# Patient Record
Sex: Female | Born: 1975 | Race: Black or African American | Hispanic: No | Marital: Married | Smoking: Never smoker
Health system: Southern US, Community
[De-identification: ages and names within clinical notes are randomized; demographics above are authoritative.]

## PROBLEM LIST (undated history)

## (undated) ENCOUNTER — Inpatient Hospital Stay (HOSPITAL_COMMUNITY): Payer: Self-pay

## (undated) DIAGNOSIS — S82899A Other fracture of unspecified lower leg, initial encounter for closed fracture: Secondary | ICD-10-CM

## (undated) DIAGNOSIS — E0500 Thyrotoxicosis with diffuse goiter without thyrotoxic crisis or storm: Secondary | ICD-10-CM

## (undated) DIAGNOSIS — E0590 Thyrotoxicosis, unspecified without thyrotoxic crisis or storm: Principal | ICD-10-CM

## (undated) HISTORY — DX: Thyrotoxicosis with diffuse goiter without thyrotoxic crisis or storm: E05.00

## (undated) HISTORY — PX: NO PAST SURGERIES: SHX2092

## (undated) HISTORY — PX: FOOT SURGERY: SHX648

## (undated) HISTORY — DX: Thyrotoxicosis, unspecified without thyrotoxic crisis or storm: E05.90

## (undated) SURGERY — Surgical Case
Anesthesia: *Unknown

---

## 2004-11-01 ENCOUNTER — Other Ambulatory Visit: Admission: RE | Admit: 2004-11-01 | Discharge: 2004-11-01 | Payer: Self-pay | Admitting: Obstetrics and Gynecology

## 2006-08-29 ENCOUNTER — Ambulatory Visit: Payer: Self-pay | Admitting: Endocrinology

## 2006-09-12 ENCOUNTER — Encounter: Admission: RE | Admit: 2006-09-12 | Discharge: 2006-09-12 | Payer: Self-pay | Admitting: Endocrinology

## 2006-09-14 ENCOUNTER — Ambulatory Visit: Payer: Self-pay | Admitting: Endocrinology

## 2006-09-25 ENCOUNTER — Ambulatory Visit: Payer: Self-pay | Admitting: Endocrinology

## 2006-09-25 ENCOUNTER — Encounter: Admission: RE | Admit: 2006-09-25 | Discharge: 2006-09-25 | Payer: Self-pay | Admitting: Endocrinology

## 2006-10-09 ENCOUNTER — Ambulatory Visit: Payer: Self-pay | Admitting: Endocrinology

## 2006-11-22 ENCOUNTER — Encounter: Payer: Self-pay | Admitting: Endocrinology

## 2006-11-22 ENCOUNTER — Ambulatory Visit: Payer: Self-pay | Admitting: Endocrinology

## 2006-11-22 DIAGNOSIS — E0590 Thyrotoxicosis, unspecified without thyrotoxic crisis or storm: Secondary | ICD-10-CM

## 2006-11-22 HISTORY — DX: Thyrotoxicosis, unspecified without thyrotoxic crisis or storm: E05.90

## 2006-12-17 ENCOUNTER — Encounter: Payer: Self-pay | Admitting: *Deleted

## 2006-12-28 ENCOUNTER — Telehealth (INDEPENDENT_AMBULATORY_CARE_PROVIDER_SITE_OTHER): Payer: Self-pay | Admitting: *Deleted

## 2007-02-04 ENCOUNTER — Telehealth: Payer: Self-pay | Admitting: Endocrinology

## 2007-02-21 ENCOUNTER — Encounter: Payer: Self-pay | Admitting: Endocrinology

## 2007-03-12 ENCOUNTER — Ambulatory Visit: Payer: Self-pay | Admitting: Endocrinology

## 2007-03-12 ENCOUNTER — Telehealth: Payer: Self-pay | Admitting: Endocrinology

## 2007-04-05 ENCOUNTER — Telehealth: Payer: Self-pay | Admitting: Internal Medicine

## 2007-05-02 ENCOUNTER — Encounter: Payer: Self-pay | Admitting: Endocrinology

## 2007-05-13 ENCOUNTER — Ambulatory Visit: Payer: Self-pay | Admitting: Endocrinology

## 2007-05-30 ENCOUNTER — Inpatient Hospital Stay (HOSPITAL_COMMUNITY): Admission: AD | Admit: 2007-05-30 | Discharge: 2007-06-01 | Payer: Self-pay | Admitting: Obstetrics and Gynecology

## 2007-07-30 ENCOUNTER — Telehealth (INDEPENDENT_AMBULATORY_CARE_PROVIDER_SITE_OTHER): Payer: Self-pay | Admitting: *Deleted

## 2007-08-05 ENCOUNTER — Telehealth (INDEPENDENT_AMBULATORY_CARE_PROVIDER_SITE_OTHER): Payer: Self-pay | Admitting: *Deleted

## 2007-08-08 ENCOUNTER — Encounter: Payer: Self-pay | Admitting: Internal Medicine

## 2007-08-16 ENCOUNTER — Ambulatory Visit: Payer: Self-pay | Admitting: Endocrinology

## 2007-12-06 ENCOUNTER — Telehealth (INDEPENDENT_AMBULATORY_CARE_PROVIDER_SITE_OTHER): Payer: Self-pay | Admitting: *Deleted

## 2007-12-06 ENCOUNTER — Encounter: Payer: Self-pay | Admitting: Endocrinology

## 2007-12-17 ENCOUNTER — Ambulatory Visit: Payer: Self-pay | Admitting: Endocrinology

## 2008-07-06 ENCOUNTER — Telehealth (INDEPENDENT_AMBULATORY_CARE_PROVIDER_SITE_OTHER): Payer: Self-pay | Admitting: *Deleted

## 2008-07-07 ENCOUNTER — Encounter: Payer: Self-pay | Admitting: Endocrinology

## 2008-07-10 ENCOUNTER — Ambulatory Visit: Payer: Self-pay | Admitting: Endocrinology

## 2010-02-04 ENCOUNTER — Telehealth: Payer: Self-pay | Admitting: Endocrinology

## 2010-02-11 ENCOUNTER — Encounter: Payer: Self-pay | Admitting: Endocrinology

## 2010-02-21 ENCOUNTER — Ambulatory Visit: Payer: Self-pay | Admitting: Endocrinology

## 2010-04-05 NOTE — Progress Notes (Signed)
Summary: Labs  Phone Note Call from Patient Call back at Home Phone [phone]   Caller: Patient Summary of Call: Pt called stating she has appt with [facility] 12/19 and would like to have T4 done prior to appt at [facility] Pt is requesting order to be faxed. Initial call taken by: Margaret Pyle, CMA,  February 04, 2010 3:46 PM  Follow-up for Phone Call        i printed rx Follow-up by: Minus Breeding MD,  February 04, 2010 4:16 PM  Additional Follow-up for Phone Call Additional follow up Details #1::        Rx faxed to [phone] left message on machine informing pt Additional Follow-up by: Margaret Pyle, CMA,  February 07, 2010 10:01 AM    New/Updated Medications: * TSH and free t4 242.9 Prescriptions: TSH and free t4 242.9  #1 x 0   Entered by:   Margaret Pyle, CMA   Authorized by:   Minus Breeding MD   Signed by:   Margaret Pyle, CMA on 02/07/2010   Method used:   Print then Give to Patient   RxID:   [id number]

## 2010-04-07 NOTE — Assessment & Plan Note (Signed)
Summary: FU/ NWS  #   Vital Signs:  Patient profile:   35 year old female Height:      68 inches (172.72 cm) Weight:      159.25 pounds (72.39 kg) BMI:     24.30 O2 Sat:      97 % on Room air Temp:     98.2 degrees F (36.78 degrees C) oral Pulse rate:   89 / minute BP sitting:   102 / 60  (left arm) Cuff size:   regular  Vitals Entered By: Brenton Grills CMA (AAMA) (February 21, 2010 11:00 AM)  O2 Flow:  Room air CC: Follow up on thyroid/aj Is Patient Diabetic? No   Referring Provider:  horvath Primary Provider:  sanders  CC:  Follow up on thyroid/aj.  History of Present Illness: pt states she feels well in general.  she takes ptu, but has been out x 3 days.   she reports 1 day of moderate congestion in the nose, and slight assoc cough.  Current Medications (verified): 1)  Propylthiouracil 50 Mg  Tabs (Propylthiouracil) .... Take 1 By Mouth Qd 2)  Tsh .... and Free T4 242.9  Allergies (verified): No Known Drug Allergies  Past History:  Past Medical History: Last updated: 11/22/2006 Hyperthyroidism  Review of Systems  The patient denies fever and dyspnea on exertion.         denies earache  Physical Exam  General:  normal appearance.   Head:  head: no deformity eyes: no periorbital swelling, no proptosis external nose and ears are normal mouth: no lesion seen Ears:  TM's intact and clear with normal canals with grossly normal hearing.   Additional Exam:  tsh=0.9 (10 days ago)   Impression & Recommendations:  Problem # 1:  HYPERTHYROIDISM (ICD-242.90) well-controlled  Problem # 2:  uri new  Other Orders: Est. Patient Level IV (04540)  Patient Instructions: 1)  continue propylthiouracil, 50 mg once daily 2)  if ever you have fever while taking this medication, stop it and call us, because of the risk of a rare side-effect. 3)  Please schedule a follow-up appointment in 6 months. 4)  take loratadine-d (non-prescription) as needed for congestion.    5)  (update: i left message on phone-tree:  rx as we discussed) Prescriptions: PROPYLTHIOURACIL 50 MG  TABS (PROPYLTHIOURACIL) take 1 by mouth qd  #90 Each x 1   Entered and Authorized by:   Minus Breeding MD   Signed by:   Minus Breeding MD on 02/21/2010   Method used:   Electronically to        [facility] [address] * (retail)       [address]       [place], [place]  [address]       Ph: [phone] or [phone]       Fax: [phone]   RxID:   [id number]    Orders Added: 1)  Est. Patient Level IV [47425]

## 2010-05-02 ENCOUNTER — Encounter: Payer: Self-pay | Admitting: Endocrinology

## 2010-07-19 NOTE — Consult Note (Signed)
[facility]                          ENDOCRINOLOGY Connie, [name]                        MRN:          [id number]  DATE:08/29/2006                            DOB:          14-May-1975    REFERRING PHYSICIAN:  Robyn N. Allyne Gee, M.D.   REASON FOR REFERRAL:  Hyperthyroidism.   HISTORY OF PRESENT ILLNESS:  A 35 year old woman, who in March 2008, had  some screening laboratory studies at her place of employment, who was  found to have a suppressed TSH.  She states that she is able to eat all  she wants and not gain any weight.   PAST MEDICAL HISTORY:  Otherwise healthy.  She takes no medications.   SOCIAL HISTORY:  She is single.  She is here with her mother.  She works  as an Art gallery manager at [employer].   FAMILY HISTORY:  Negative for thyroid disease.   REVIEW OF SYSTEMS:  Denies the following:  Fever, tremor, palpitations,  excessive diaphoresis, shortness of breath, and anxiety.   PHYSICAL EXAMINATION:  VITAL SIGNS:  Blood pressure is 111/69, heart  rate 71, weight is 144.  GENERAL:  Healthy-appearing young woman, no distress.  SKIN:  No rash, not diaphoretic.  HEENT:  No proptosis, no periorbital swelling.  NECK:  I do not appreciate a goiter.  NEUROLOGIC:  Alert, oriented; does not appear anxious or depression.  There is no tremor.   LABORATORY STUDIES:  Forwarded by Dr. Allyne Gee.  On June 11, 2006, TSH  0.012.  On June 15, 2006, TSH 0.008.  Free T4 is 1.76 which is  borderline elevated.   IMPRESSION:  1. Hyperthyroidism of uncertain etiology.  2. Excessive appetite.  Upon treatment of her hyperthyroidism, she is      going to have to be careful not to gain weight.   PLAN:  We discussed the differential diagnosis, risks, and treatment  options of hyperthyroidism.  She states she would like to have a nuclear  medicine scan and then return for further discussion after that.     Sean A. Everardo All, MD  Electronically  Signed    SAE/MedQ  DD: 08/29/2006  DT: 08/30/2006  Job #: [id number]   cc:   Candyce Churn. Allyne Gee, M.D.  Carrington Clamp, M.D.

## 2010-07-19 NOTE — Consult Note (Signed)
[facility]                          ENDOCRINOLOGY CONSULTATION   Connie [name], Connie [name]                      MRN:          [id number]  DATE:09/25/2006                            DOB:          1975/08/06    REASON FOR VISIT:  Follow up thyroid.   HISTORY OF PRESENT ILLNESS:  A 35 year old woman who went for iodine-131  therapy, and her serum pregnancy test was positive.  She states that her  last menstrual period was two weeks ago.  She states she is feeling well  in general and does not have any symptoms of pregnancy.   PAST MEDICAL HISTORY:  This is her first pregnancy.   REVIEW OF SYSTEMS:  Denies fever, nausea, and vomiting.   PHYSICAL EXAMINATION:  Blood pressure is 117/70, heart rate 72,  temperature 97.2.  The weight is 142.  GENERAL:  No distress.  EXTREMITIES:  No edema.  NECK:  I do not appreciate a goiter.   IMPRESSION:  1. Hyperthyroidism.  2. Recently diagnosed pregnancy.   PLAN:  1. Cancel iodine i-131 therapy.  2. PTU 100 mg twice daily.  3. I have told her that if ever she has fever on this, she must      discontinue it and call us because of the possibility of a rare      side effect.  4. Return in three weeks.  5. Please contact Dr. Henderson Cloud and get yourself an appointment as a new      obstetrical patient.     Sean A. Everardo All, MD  Electronically Signed    SAE/MedQ  DD: 09/26/2006  DT: 09/27/2006  Job #: [id number]   cc:   Connie [name], M.D.  Connie [name], M.D.

## 2010-07-19 NOTE — Consult Note (Signed)
[facility]                          ENDOCRINOLOGY CONSULTATION   RHEALYNN, MYHRE                      MRN:          [id number]  DATE:10/09/2006                            DOB:          03-Apr-1975    REASON FOR VISIT:  Followup hyperthyroidism.   HISTORY OF THE PRESENT ILLNESS:  A 35 year old woman who states she is  now 6 weeks plus 2 days gestation.  On PTU 100 mg twice a day, she had  abdominal pain, nausea, and vomiting.  She states she reduced this to 50  mg twice a day and the symptoms have resolved.   PAST MEDICAL HISTORY:  Otherwise healthy.  She takes no other  medications.   REVIEW OF SYSTEMS:  She denies fever.  She has gained 5 pounds so far.   PHYSICAL EXAMINATION:  Blood pressure 103/63, heart rate 72, temperature  is 97.5, the weight is 147.  GENERAL:  No distress.  THYROID:  I do not notice a goiter.  NEUROLOGIC:  Alert, well oriented, does not appear anxious nor  depressed.   IMPRESSION:  1. Hyperthyroidism.  She is intolerant of PTU at 100 mg twice a day.  2. Six weeks plus two days gestation.   PLAN:  1. Reduce PTU to 50 mg twice a day.  2. Recheck thyroid function studies.  3. Return in 30 days.   ADDENDUM:  As of the date of this dictation, October 14, 2006, no result  has been reported for her thyroid function studies.  Therefore, will  call her to see if she had them drawn.     Sean A. Everardo All, MD  Electronically Signed    SAE/MedQ  DD: 10/14/2006  DT: 10/15/2006  Job #: [id number]   cc:   Carrington Clamp, M.D.

## 2010-11-28 LAB — CBC
HCT: 30.9 — ABNORMAL LOW
HCT: 37.1
Hemoglobin: 10.6 — ABNORMAL LOW
Hemoglobin: 13.1
MCHC: 34.2
MCV: 97.1
RBC: 3.19 — ABNORMAL LOW
RBC: 3.91
RDW: 13.4

## 2011-05-22 ENCOUNTER — Telehealth: Payer: Self-pay | Admitting: *Deleted

## 2011-05-22 NOTE — Telephone Encounter (Signed)
Pt wants thyroid labs faxed to [facility] so that she can have labs done before appointment.

## 2011-05-23 NOTE — Telephone Encounter (Signed)
Pt informed of MD's advisement but wants labs faxed to [facility] before appointment so that labs can be addressed during appointment and changes made at appointment.

## 2011-05-23 NOTE — Telephone Encounter (Signed)
Pt is too far overdue for appt, so we'll need to do here.

## 2011-05-23 NOTE — Telephone Encounter (Signed)
Ov is overdue.  Let's check then

## 2011-05-25 NOTE — Telephone Encounter (Signed)
Pt informed of MD's advisement, pt states that she will callback to make appointment but was upset because she wants lab faxed to another provider.

## 2011-05-26 ENCOUNTER — Encounter: Payer: Self-pay | Admitting: Endocrinology

## 2011-05-26 ENCOUNTER — Ambulatory Visit (INDEPENDENT_AMBULATORY_CARE_PROVIDER_SITE_OTHER): Payer: BC Managed Care – PPO | Admitting: Endocrinology

## 2011-05-26 VITALS — BP 102/62 | HR 61 | Temp 98.0°F

## 2011-05-26 DIAGNOSIS — E0590 Thyrotoxicosis, unspecified without thyrotoxic crisis or storm: Secondary | ICD-10-CM

## 2011-05-26 MED ORDER — METHIMAZOLE 5 MG PO TABS: 5.0000 mg | ORAL_TABLET | Freq: Three times a day (TID) | ORAL | Status: DC

## 2011-05-26 NOTE — Progress Notes (Signed)
  Subjective:    Patient ID: Connie [name], female    DOB: 1975-05-20, 36 y.o.   MRN: [id number]  HPI Pt returns for f/u of hyperthyroidism, of uncertain etiology.  She has been on ptu, due to pregnancy in 2009.  pt states she feels well in general.  Past Medical History  Diagnosis Date  . HYPERTHYROIDISM 11/22/2006    Qualifier: Diagnosis of  By: Everardo All MD, Advith Martine A     No past surgical history on file.  History   Social History  . Marital Status: Married    Spouse Name: N/A    Number of Children: N/A  . Years of Education: N/A   Occupational History  . Not on file.   Social History Main Topics  . Smoking status: Never Smoker   . Smokeless tobacco: Not on file  . Alcohol Use: Not on file  . Drug Use: Not on file  . Sexually Active: Not on file   Other Topics Concern  . Not on file   Social History Narrative  . No narrative on file    No current outpatient prescriptions on file prior to visit.    No Known Allergies  No family history on file.  BP 102/62  Pulse 61  Temp(Src) 98 F (36.7 C) (Oral)  SpO2 99%  LMP 05/25/2011    Review of Systems Denies fever    Objective:   Physical Exam VITAL SIGNS:  See vs page GENERAL: no distress NECK: There is no palpable thyroid enlargement.  No thyroid nodule is palpable.  No palpable lymphadenopathy at the anterior neck.      Assessment & Plan:

## 2011-05-26 NOTE — Patient Instructions (Addendum)
Here is a request to get your blood test done in [place].   Please return in 1 year. You should also have the blood tests done in [place] in 6 months.   Change propylthiouracil to methimazole, 5 mg daily.  Although side-effect are are, they are even less likely with the methimazole.   if ever you have fever while taking methimazole, stop it and call us, because of the risk of a rare side-effect.

## 2011-09-01 ENCOUNTER — Telehealth: Payer: Self-pay | Admitting: Endocrinology

## 2011-09-01 NOTE — Telephone Encounter (Signed)
Forward to Dr. Everardo All on 09-01-11 ym

## 2012-05-31 ENCOUNTER — Ambulatory Visit: Payer: BC Managed Care – PPO | Admitting: Endocrinology

## 2012-05-31 DIAGNOSIS — Z0289 Encounter for other administrative examinations: Secondary | ICD-10-CM

## 2012-09-10 ENCOUNTER — Encounter: Payer: Self-pay | Admitting: Nurse Practitioner

## 2013-01-10 ENCOUNTER — Ambulatory Visit: Payer: BC Managed Care – PPO | Admitting: Endocrinology

## 2013-01-24 ENCOUNTER — Encounter: Payer: Self-pay | Admitting: Endocrinology

## 2013-01-24 ENCOUNTER — Ambulatory Visit (INDEPENDENT_AMBULATORY_CARE_PROVIDER_SITE_OTHER): Payer: BC Managed Care – PPO | Admitting: Endocrinology

## 2013-01-24 VITALS — BP 110/64 | HR 75 | Temp 98.4°F | Resp 10 | Wt 152.8 lb

## 2013-01-24 DIAGNOSIS — E0590 Thyrotoxicosis, unspecified without thyrotoxic crisis or storm: Secondary | ICD-10-CM

## 2013-01-24 LAB — TSH: TSH: 2.33 u[IU]/mL (ref 0.35–5.50)

## 2013-01-24 NOTE — Patient Instructions (Signed)
blood tests are being requested for you today.  We'll contact you with results. Please come back for a follow-up appointment in 6 months, or sooner if the pregnancy happens.   if ever you have fever while taking methimazole, stop it and call us, because of the risk of a rare side-effect.

## 2013-01-24 NOTE — Progress Notes (Signed)
  Subjective:    Patient ID: Connie [name], female    DOB: [birth year], 37 y.o.   MRN: [id number]  HPI Pt returns for f/u of hyperthyroidism, due to grave's dz.  It was dx'ed in 2008, on a routine blood test.  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  She was scheduled for i-131 rx, but pregnancy test on that day was positive.  She was started on ptu, and has been on it since.  She takes tapazole as rx'ed.  pt states she feels well in general.  She has been attempting pregnancy recently, but without success.   Past Medical History  Diagnosis Date  . HYPERTHYROIDISM 11/22/2006    Qualifier: Diagnosis of  By: Everardo All MD, Gregary Signs A     History reviewed. No pertinent past surgical history.  History   Social History  . Marital Status: Married    Spouse Name: N/A    Number of Children: N/A  . Years of Education: N/A   Occupational History  . Not on file.   Social History Main Topics  . Smoking status: Never Smoker   . Smokeless tobacco: Not on file  . Alcohol Use: Not on file  . Drug Use: Not on file  . Sexual Activity: Not on file   Other Topics Concern  . Not on file   Social History Narrative  . No narrative on file    Current Outpatient Prescriptions on File Prior to Visit  Medication Sig Dispense Refill  . methimazole (TAPAZOLE) 5 MG tablet Take 1 tablet (5 mg total) by mouth 3 (three) times daily. Please cancel the 30 day rx just sent  90 tablet  0   No current facility-administered medications on file prior to visit.   No Known Allergies  Family History  Problem Relation Age of Onset  . Diabetes Mother   . Heart disease Mother   . Diabetes Father   . Heart disease Father    BP 110/64  Pulse 75  Temp(Src) 98.4 F (36.9 C) (Oral)  Resp 10  Wt 152 lb 12.8 oz (69.31 kg)  SpO2 96%  Review of Systems Denies fever    Objective:   Physical Exam VITAL SIGNS:  See vs page GENERAL: no distress NECK: There is no palpable thyroid enlargement.  No thyroid nodule is palpable.  No  palpable lymphadenopathy at the anterior neck.   Lab Results  Component Value Date   TSH 2.33 01/24/2013      Assessment & Plan:  Hyperthyroidism: well-controlled

## 2013-02-07 ENCOUNTER — Ambulatory Visit: Payer: BC Managed Care – PPO | Admitting: Endocrinology

## 2013-05-29 ENCOUNTER — Other Ambulatory Visit: Payer: Self-pay | Admitting: Obstetrics and Gynecology

## 2013-05-29 DIAGNOSIS — N6310 Unspecified lump in the right breast, unspecified quadrant: Secondary | ICD-10-CM

## 2013-06-02 ENCOUNTER — Ambulatory Visit
Admission: RE | Admit: 2013-06-02 | Discharge: 2013-06-02 | Disposition: A | Discharge status: Home / Self Care | Payer: BC Managed Care – PPO | Source: Ambulatory Visit | Attending: Obstetrics and Gynecology | Admitting: Obstetrics and Gynecology

## 2013-06-02 ENCOUNTER — Other Ambulatory Visit: Payer: BC Managed Care – PPO

## 2013-06-02 DIAGNOSIS — N6310 Unspecified lump in the right breast, unspecified quadrant: Secondary | ICD-10-CM

## 2013-06-06 ENCOUNTER — Other Ambulatory Visit: Payer: BC Managed Care – PPO

## 2013-12-22 ENCOUNTER — Encounter: Payer: Self-pay | Admitting: Endocrinology

## 2013-12-22 ENCOUNTER — Ambulatory Visit (INDEPENDENT_AMBULATORY_CARE_PROVIDER_SITE_OTHER): Payer: BC Managed Care – PPO | Admitting: Endocrinology

## 2013-12-22 VITALS — BP 116/52 | HR 72 | Temp 98.3°F | Ht 68.0 in | Wt 155.0 lb

## 2013-12-22 DIAGNOSIS — E0500 Thyrotoxicosis with diffuse goiter without thyrotoxic crisis or storm: Secondary | ICD-10-CM

## 2013-12-22 NOTE — Progress Notes (Signed)
   Subjective:    Patient ID: Connie [name], female    DOB: 03-10-1975, 38 y.o.   MRN: [id number]  HPI Pt returns for f/u of hyperthyroidism, due to grave's dz.  It was dx'ed in 2008, on a routine blood test.  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  She was scheduled for i-131 rx in 2008, but pregnancy test on that day was positive. She takes tapazole as rx'ed.  pt states she feels well in general.  She has been attempting pregnancy recently, but without success.  Past Medical History  Diagnosis Date  . HYPERTHYROIDISM 11/22/2006    Qualifier: Diagnosis of  By: Everardo All MD, Jermisha Hoffart A     No past surgical history on file.  History   Social History  . Marital Status: Married    Spouse Name: N/A    Number of Children: N/A  . Years of Education: N/A   Occupational History  . Not on file.   Social History Main Topics  . Smoking status: Never Smoker   . Smokeless tobacco: Not on file  . Alcohol Use: Not on file  . Drug Use: Not on file  . Sexual Activity: Not on file   Other Topics Concern  . Not on file   Social History Narrative  . No narrative on file    No current outpatient prescriptions on file prior to visit.   No current facility-administered medications on file prior to visit.    No Known Allergies  Family History  Problem Relation Age of Onset  . Diabetes Mother   . Heart disease Mother   . Diabetes Father   . Heart disease Father    BP 116/52  Pulse 72  Temp(Src) 98.3 F (36.8 C) (Oral)  Ht 5\' 8"  (1.727 m)  Wt 155 lb (70.308 kg)  BMI 23.57 kg/m2  SpO2 98%  Review of Systems Denies fever    Objective:   Physical Exam VITAL SIGNS:  See vs page GENERAL: no distress NECK: There is no palpable thyroid enlargement.  No thyroid nodule is palpable.  No palpable lymphadenopathy at the anterior neck.     outside test results are reviewed: TSH=6    Assessment & Plan:  Hyperthyroidism, overcontrolled.     Patient is advised the following: Patient Instructions    Please stop taking the methimazole. Please recheck the thyroid blood tests in 1 month. Please come back for a follow-up appointment in 4 months

## 2013-12-22 NOTE — Patient Instructions (Addendum)
Please stop taking the methimazole. Please recheck the thyroid blood tests in 1 month. Please come back for a follow-up appointment in 4 months

## 2014-02-05 ENCOUNTER — Telehealth: Payer: Self-pay | Admitting: Endocrinology

## 2014-02-05 NOTE — Telephone Encounter (Signed)
Patient asked if Dr Everardo All could fax her lab orders over to [facility] care Phone # [phone] (she lost the first set of labs orders that was given to her)

## 2014-02-06 NOTE — Telephone Encounter (Signed)
Labs faxed. Pt advised.

## 2014-02-06 NOTE — Telephone Encounter (Signed)
Patient stated that [facility] care haven't received lab order yet, could they be faxed today? Thank you

## 2014-02-06 NOTE — Telephone Encounter (Signed)
See below and please advise. I could not see orders.  Thanks!

## 2014-02-06 NOTE — Telephone Encounter (Signed)
i printed 

## 2014-02-09 ENCOUNTER — Telehealth: Payer: Self-pay | Admitting: Endocrinology

## 2014-02-09 NOTE — Telephone Encounter (Signed)
please call patient: Thyroid blood tests are normal--good i'll see you next time.

## 2014-02-09 NOTE — Telephone Encounter (Signed)
Lvom advising of below. Requested call back if pt would like to discuss.  

## 2014-02-10 ENCOUNTER — Telehealth: Payer: Self-pay | Admitting: Endocrinology

## 2014-02-10 NOTE — Telephone Encounter (Signed)
Patient is calling for the results of her lab work °

## 2014-02-10 NOTE — Telephone Encounter (Signed)
Pt advised that thyroid blood test were normal. Pt voiced understanding.

## 2014-02-23 ENCOUNTER — Encounter: Payer: Self-pay | Admitting: Endocrinology

## 2014-03-03 ENCOUNTER — Other Ambulatory Visit: Payer: Self-pay | Admitting: *Deleted

## 2014-03-03 ENCOUNTER — Telehealth: Payer: Self-pay | Admitting: Endocrinology

## 2014-03-03 DIAGNOSIS — E0590 Thyrotoxicosis, unspecified without thyrotoxic crisis or storm: Secondary | ICD-10-CM

## 2014-03-03 NOTE — Telephone Encounter (Signed)
Patient would like her lab order faxed over to [facility] Fax # [phone]

## 2014-03-03 NOTE — Telephone Encounter (Signed)
OK. I assume this is for TFTs.Marland Kitchen..Marland Kitchen

## 2014-03-03 NOTE — Telephone Encounter (Signed)
Please read note below and advise.  

## 2014-03-03 NOTE — Telephone Encounter (Signed)
[facility] [phone].

## 2014-03-06 NOTE — L&D Delivery Note (Signed)
Patient was C/C/crowning with [name]. Pt moved to OR for delivery. [name] second degree episiotomy was done for room for breech extraction.  Pt pushed for 5 minutes with epidural.   NSVD  [name] female infant, Apgars 9,9, weight P.   Cord clamped and baby handed off.  Vaginal exam revealed complete breech of [name]. ROM second sac, clear fluid. Feet of [name] grasped together and breech extraction done without complication. Delivery of after coming head was spontaneous.  Breech delivery [name], Apgars 8,9, weight P. Cord marked.  Cord bloods obained on both.    The patient had repair of second degree midline episiotomy with 2-0 vicryl. Fundus was firm. EBL was expected amount. Placenta was delivered intact and sent to path. Vagina was clear.  Babies were vigorous and were doing skin to skin with mother after moving from OR.  Connie [name]

## 2014-04-08 ENCOUNTER — Ambulatory Visit (INDEPENDENT_AMBULATORY_CARE_PROVIDER_SITE_OTHER): Payer: Self-pay | Admitting: Endocrinology

## 2014-04-08 ENCOUNTER — Encounter: Payer: Self-pay | Admitting: Endocrinology

## 2014-04-08 VITALS — BP 112/54 | HR 71 | Temp 98.2°F | Ht 68.0 in | Wt 157.0 lb

## 2014-04-08 DIAGNOSIS — E0590 Thyrotoxicosis, unspecified without thyrotoxic crisis or storm: Secondary | ICD-10-CM | POA: Diagnosis not present

## 2014-04-08 LAB — T4, FREE: Free T4: 0.82 ng/dL (ref 0.60–1.60)

## 2014-04-08 LAB — TSH: TSH: 0.81 u[IU]/mL (ref 0.35–4.50)

## 2014-04-08 NOTE — Progress Notes (Signed)
   Subjective:    Patient ID: Connie [name], female    DOB: [birth year], 39 y.o.   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  pt states she feels no different, and well in general.  Denies tremor.  Past Medical History  Diagnosis Date  . HYPERTHYROIDISM 11/22/2006    Qualifier: Diagnosis of  By: Everardo All MD, Debraann Livingstone A     No past surgical history on file.  History   Social History  . Marital Status: Married    Spouse Name: N/A    Number of Children: N/A  . Years of Education: N/A   Occupational History  . Not on file.   Social History Main Topics  . Smoking status: Never Smoker   . Smokeless tobacco: Not on file  . Alcohol Use: Not on file  . Drug Use: Not on file  . Sexual Activity: Not on file   Other Topics Concern  . Not on file   Social History Narrative    No current outpatient prescriptions on file prior to visit.   No current facility-administered medications on file prior to visit.    No Known Allergies  Family History  Problem Relation Age of Onset  . Diabetes Mother   . Heart disease Mother   . Diabetes Father   . Heart disease Father     BP 112/54 mmHg  Pulse 71  Temp(Src) 98.2 F (36.8 C) (Oral)  Ht 5\' 8"  (1.727 m)  Wt 157 lb (71.215 kg)  BMI 23.88 kg/m2  SpO2 97%    Review of Systems Denies weight change and palpitations    Objective:   Physical Exam VITAL SIGNS:  See vs page GENERAL: no distress NECK: There is no palpable thyroid enlargement.  No thyroid nodule is palpable.  No palpable lymphadenopathy at the anterior neck.    Lab Results  Component Value Date   TSH 0.81 04/08/2014      Assessment & Plan:  Hyperthyroidism.  Euthyroid now, but she is at risk  for recurrence.  Patient is advised the following: Patient Instructions  blood tests are being requested for you today.  We'll let you know about the results. If it is high, we'll resume the methimazole at a lower amount.

## 2014-04-08 NOTE — Patient Instructions (Addendum)
blood tests are being requested for you today.  We'll let you know about the results. If it is high, we'll resume the methimazole at a lower amount.

## 2014-04-20 ENCOUNTER — Ambulatory Visit: Payer: BC Managed Care – PPO | Admitting: Endocrinology

## 2014-05-22 ENCOUNTER — Telehealth: Payer: Self-pay | Admitting: Endocrinology

## 2014-05-22 DIAGNOSIS — E0590 Thyrotoxicosis, unspecified without thyrotoxic crisis or storm: Secondary | ICD-10-CM

## 2014-05-22 NOTE — Telephone Encounter (Signed)
Pt has had what feels like a racing heartbeat for the last week or so since Dr. Everardo All took her off of the methimazole.

## 2014-05-22 NOTE — Telephone Encounter (Signed)
See note below and please advise, Thanks! 

## 2014-05-22 NOTE — Telephone Encounter (Signed)
Please come in for thyroid labs.  i have have ordered

## 2014-05-25 NOTE — Telephone Encounter (Signed)
Pt is coming to have lab work done 05/26/2014.

## 2014-05-26 ENCOUNTER — Other Ambulatory Visit: Payer: BLUE CROSS/BLUE SHIELD

## 2014-05-28 ENCOUNTER — Other Ambulatory Visit (INDEPENDENT_AMBULATORY_CARE_PROVIDER_SITE_OTHER): Payer: BLUE CROSS/BLUE SHIELD

## 2014-05-28 DIAGNOSIS — E0590 Thyrotoxicosis, unspecified without thyrotoxic crisis or storm: Secondary | ICD-10-CM | POA: Diagnosis not present

## 2014-05-28 LAB — TSH: TSH: 0.06 u[IU]/mL — ABNORMAL LOW (ref 0.35–4.50)

## 2014-05-28 LAB — T4, FREE: FREE T4: 3.88 ng/dL — AB (ref 0.60–1.60)

## 2014-06-03 ENCOUNTER — Telehealth: Payer: Self-pay | Admitting: Endocrinology

## 2014-06-03 ENCOUNTER — Other Ambulatory Visit: Payer: Self-pay | Admitting: Endocrinology

## 2014-06-03 MED ORDER — METHIMAZOLE 10 MG PO TABS: 10.0000 mg | ORAL_TABLET | Freq: Three times a day (TID) | ORAL | Status: DC

## 2014-06-03 MED ORDER — METHIMAZOLE 10 MG PO TABS
10.0000 mg | ORAL_TABLET | Freq: Three times a day (TID) | ORAL | Status: DC
Start: 2014-06-03 — End: 2014-06-12

## 2014-06-03 NOTE — Telephone Encounter (Signed)
[facility] [place] [address] [address] [employer] information, and she need it today

## 2014-06-03 NOTE — Telephone Encounter (Signed)
Rx sent to pharmacy per request. Unable to reach pt and advise that rx had been sent.

## 2014-06-12 ENCOUNTER — Telehealth: Payer: Self-pay | Admitting: Endocrinology

## 2014-06-12 MED ORDER — PROPYLTHIOURACIL 50 MG PO TABS: 100.0000 mg | ORAL_TABLET | Freq: Three times a day (TID) | ORAL | Status: DC

## 2014-06-12 NOTE — Telephone Encounter (Signed)
See note below and please advise, Thanks! 

## 2014-06-12 NOTE — Telephone Encounter (Signed)
It probably doesn't matter, but i have sent a prescription to your pharmacy, to change to a different med. i'll see you next time.

## 2014-06-12 NOTE — Telephone Encounter (Signed)
Patient called stating that she is trying to conceive and is concerned with the Methimazole   Please advise patient if she is to stop using this   Thank you

## 2014-06-12 NOTE — Telephone Encounter (Signed)
Pt advised that rx has been sent

## 2014-08-21 LAB — US OB FOLLOW UP

## 2014-08-21 LAB — OB RESULTS CONSOLE HEPATITIS B SURFACE ANTIGEN: HEP B S AG: NEGATIVE

## 2014-08-21 LAB — OB RESULTS CONSOLE RUBELLA ANTIBODY, IGM: Rubella: IMMUNE

## 2014-08-21 LAB — OB RESULTS CONSOLE GC/CHLAMYDIA
Chlamydia: NEGATIVE
Gonorrhea: NEGATIVE

## 2014-08-21 LAB — OB RESULTS CONSOLE HGB/HCT, BLOOD
HCT: 28 %
Hemoglobin: 11.7 g/dL

## 2014-08-21 LAB — OB RESULTS CONSOLE PLATELET COUNT: PLATELETS: 257 10*3/uL

## 2014-08-21 LAB — OB RESULTS CONSOLE HIV ANTIBODY (ROUTINE TESTING): HIV: NONREACTIVE

## 2014-08-21 LAB — OB RESULTS CONSOLE RPR: RPR: NONREACTIVE

## 2014-08-24 ENCOUNTER — Encounter (HOSPITAL_COMMUNITY): Payer: Self-pay | Admitting: Obstetrics and Gynecology

## 2014-09-01 ENCOUNTER — Ambulatory Visit (HOSPITAL_COMMUNITY)
Admission: RE | Admit: 2014-09-01 | Discharge: 2014-09-01 | Disposition: A | Discharge status: Home / Self Care | Payer: BLUE CROSS/BLUE SHIELD | Source: Ambulatory Visit | Attending: Obstetrics and Gynecology | Admitting: Obstetrics and Gynecology

## 2014-09-01 DIAGNOSIS — O09521 Supervision of elderly multigravida, first trimester: Secondary | ICD-10-CM

## 2014-09-01 DIAGNOSIS — Z3A12 12 weeks gestation of pregnancy: Secondary | ICD-10-CM

## 2014-09-01 DIAGNOSIS — O30049 Twin pregnancy, dichorionic/diamniotic, unspecified trimester: Secondary | ICD-10-CM

## 2014-09-03 ENCOUNTER — Encounter (HOSPITAL_COMMUNITY): Payer: Self-pay

## 2014-09-03 DIAGNOSIS — O09529 Supervision of elderly multigravida, unspecified trimester: Secondary | ICD-10-CM | POA: Insufficient documentation

## 2014-09-03 NOTE — Progress Notes (Signed)
Genetic Counseling  High-Risk Gestation Note  Appointment Date:  09/01/2014 Referred By: Carrington Clamp, MD Date of Birth:  [birth year]   Pregnancy History: G2P1 Estimated Date of Delivery: 03/15/15 Estimated Gestational Age: [redacted]w[redacted]d Attending: Particia Nearing, MD   Connie [name] was seen for genetic counseling because of a maternal age of 39 y.o. in a dichorionic/diamniotic twin gestation. She will be 39 years old at delivery.      In summary:  Discussed maternal age-related chance for fetal aneuploidy in dichorionic diamniotic twin gestation  Discussed available screening and testing options; Patient elected to proceed with NIPS ([facility]) today, understanding it is not diagnostic and has lower sensitivity in twin gestation  She declined CVS and amniocentesis today but would possibly consider amniocentesis in the case of positive NIPS result and/or findings on second trimester detailed ultrasound  She was counseled regarding maternal age and the association with risk for chromosome conditions due to nondisjunction with aging of the ova.   We reviewed chromosomes, nondisjunction, and the associated 1 in 45 risk for fetal aneuploidy related to a maternal age of 39 year old at in a twin gestation.  She was counseled that the risk for aneuploidy decreases as gestational age increases, accounting for those pregnancies which spontaneously abort.  We specifically discussed Down syndrome (trisomy 70), trisomies 46 and 42, and sex chromosome aneuploidies (47,XXX and 47,XXY) including the common features and prognoses of each.   We reviewed available screening options including noninvasive prenatal screening (NIPS)/cell free DNA (cfDNA) testing and detailed ultrasound.  She was counseled that screening tests are used to modify a patient's a priori risk for aneuploidy, typically based on age. This estimate provides a pregnancy specific risk assessment. We reviewed the benefits and limitations of each  option. Specifically, we discussed the conditions for which each test screens, the detection rates, and false positive rates of each. We reviewed that NIPS has lower detection rate in a twin gestation and that the results cannot distinguish between each twin. She was also counseled regarding diagnostic testing via CVS and amniocentesis. We reviewed the approximate 1 in 100 risk for complications for CVS and the approximate 1 in 300-500 risk for complications for amniocentesis, including spontaneous pregnancy loss. After consideration of all the options, she elected to proceed with NIPS today ([facility] through [facility]).  Those results will be available in 8-10 days. She declined CVS and amniocentesis at this time. She may consider amniocentesis pending results of NIPS and detailed ultrasound in the second trimester. She understands that screening tests cannot rule out all birth defects or genetic syndromes. The patient was advised of this limitation and states she still does not want additional testing at this time.    Detailed ultrasound is also available to the patient. Her OB office may contact us, if follow-up with our office is desired.   Connie [name] was provided with written information regarding sickle cell anemia (SCA) including the carrier frequency and incidence in the Hispanic/African-American population, the availability of carrier testing and prenatal diagnosis if indicated.  In addition, we discussed that hemoglobinopathies are routinely screened for as part of the [facility] newborn screening panel.  She declined hemoglobin electrophoresis today, given that she possibly had this screening performed prior to conception. The patient reported that she had a blood test performed prior to conception. Her description of the blood test seemed consistent with expanded carrier screening, which may or may not have included hemoglobinopathies. She reported that this blood test was normal, though  we do not  have medical documentation of this result at this time. If screening for hemoglobinopathies was not performed, this is available to her via hemoglobin electrophoresis.   Both family histories were reviewed and found to be contributory for Asperger syndrome for a cousin to the father of the pregnancy. The patient reported that this cousin is female but did not know if it was a maternal or paternal relative to the father of the pregnancy. We discussed that Asperger syndrome is part of the spectrum of conditions referred to as Autistic spectrum disorders (ASD). We discussed that ASDs are among the most common neurodevelopmental disorders, with approximately 1 in 68 children meeting criteria for ASD, according to the Centers for Disease Control. Approximately 80% of individuals diagnosed are female. There is strong evidence that genetic factors play a critical role in development of ASD. There have been recent advances in identifying specific genetic causes of ASD, however, there are still many individuals for whom the etiology of the ASD is not known.  In the absence of an identified genetic etiology, prenatal screening or testing would not be available in the current pregnancy for the autism spectrum disorders in the family. Given the reported family history and degree of relation, recurrence risk would likely be similar to the general population risk. Without further information regarding the provided family history, an accurate genetic risk cannot be calculated. Further genetic counseling is warranted if more information is obtained.  [name] denied exposure to environmental toxins or chemical agents. She denied the use of tobacco or street drugs. She reported drinking a sample glass of wine approximately 2 months ago.  Prenatal alcohol exposure can increase the risk for growth delays, small head size, heart defects, eye and facial differences, as well as behavior problems and learning disabilities. The risk of  these to occur tends to increase with the amount of alcohol consumed. However, because there is no identified safe amount of alcohol in pregnancy, it is recommended to completely avoid alcohol in pregnancy. Given the reported amount of exposure, risk for associated effects are likely low in the current pregnancy. She denied significant viral illnesses during the course of her pregnancy. Her medical and surgical histories were contributory for thyroid disease for which she is currently treated with medication.   I counseled Connie [name] regarding the above risks and available options.  The approximate face-to-face time with the genetic counselor was 45 minutes.  Quinn Plowman, MS,  Certified Genetic Counselor 09/03/2014

## 2014-09-04 ENCOUNTER — Encounter (HOSPITAL_COMMUNITY): Payer: BLUE CROSS/BLUE SHIELD

## 2014-09-08 ENCOUNTER — Telehealth (HOSPITAL_COMMUNITY): Payer: Self-pay | Admitting: MS"

## 2014-09-08 ENCOUNTER — Encounter (HOSPITAL_COMMUNITY): Payer: Self-pay | Admitting: Obstetrics and Gynecology

## 2014-09-08 NOTE — Telephone Encounter (Signed)
Left message for patient to return call.  Connie [name] Connie [name] 09/08/2014

## 2014-09-10 ENCOUNTER — Telehealth (HOSPITAL_COMMUNITY): Payer: Self-pay | Admitting: MS"

## 2014-09-10 NOTE — Telephone Encounter (Signed)
Called Connie [name] to discuss her cell free DNA test results.  Connie [name] had  [facility] testing through [facility].  Testing was offered because of advanced maternal age in a twin gestation.   The patient was identified by name and DOB.  We reviewed that these are within normal limits, showing a less than 1 in 10,000 risk for trisomies 21, 18 and 13.  X and Y analysis are not performed by [facility] for a twin gestation. We reviewed that this testing identifies approximately 90% of pregnancies with trisomy 35 in a twin gestation. She understands that this testing does not identify all genetic conditions.  All questions were answered to her satisfaction, she was encouraged to call with additional questions or concerns.  Quinn Plowman, MS Certified Genetic Counselor 09/10/2014 1:47 PM

## 2014-09-11 ENCOUNTER — Other Ambulatory Visit (HOSPITAL_COMMUNITY): Payer: Self-pay | Admitting: Obstetrics and Gynecology

## 2014-09-30 LAB — OB RESULTS CONSOLE HGB/HCT, BLOOD: Hemoglobin: 9.6 g/dL

## 2014-10-05 DIAGNOSIS — S82899A Other fracture of unspecified lower leg, initial encounter for closed fracture: Secondary | ICD-10-CM

## 2014-10-05 HISTORY — DX: Other fracture of unspecified lower leg, initial encounter for closed fracture: S82.899A

## 2014-10-17 ENCOUNTER — Inpatient Hospital Stay (HOSPITAL_COMMUNITY)
Admission: AD | Admit: 2014-10-17 | Discharge: 2014-10-17 | Disposition: A | Discharge status: Home / Self Care | Payer: BLUE CROSS/BLUE SHIELD | Source: Ambulatory Visit | Attending: Obstetrics and Gynecology | Admitting: Obstetrics and Gynecology

## 2014-10-17 ENCOUNTER — Inpatient Hospital Stay (HOSPITAL_COMMUNITY): Payer: BLUE CROSS/BLUE SHIELD

## 2014-10-17 ENCOUNTER — Encounter (HOSPITAL_COMMUNITY): Payer: Self-pay | Admitting: *Deleted

## 2014-10-17 DIAGNOSIS — R109 Unspecified abdominal pain: Secondary | ICD-10-CM | POA: Diagnosis not present

## 2014-10-17 DIAGNOSIS — O9989 Other specified diseases and conditions complicating pregnancy, childbirth and the puerperium: Secondary | ICD-10-CM

## 2014-10-17 DIAGNOSIS — O26899 Other specified pregnancy related conditions, unspecified trimester: Secondary | ICD-10-CM

## 2014-10-17 DIAGNOSIS — Z3A18 18 weeks gestation of pregnancy: Secondary | ICD-10-CM | POA: Insufficient documentation

## 2014-10-17 DIAGNOSIS — O30002 Twin pregnancy, unspecified number of placenta and unspecified number of amniotic sacs, second trimester: Secondary | ICD-10-CM

## 2014-10-17 HISTORY — DX: Other fracture of unspecified lower leg, initial encounter for closed fracture: S82.899A

## 2014-10-17 LAB — GLUCOSE, CAPILLARY: Glucose-Capillary: 109 mg/dL — ABNORMAL HIGH (ref 65–99)

## 2014-10-17 NOTE — Discharge Instructions (Signed)

## 2014-10-17 NOTE — MAU Provider Note (Signed)
History     CSN: [id number]  Arrival date and time: 10/17/14 8657   First Provider Initiated Contact with Patient 10/17/14 2018      Chief Complaint  Patient presents with  . Abdominal Cramping   HPI  Ms. Connie [name] is a 39 y.o. G2P1 at [redacted]w[redacted]d with twins who presents to MAU today with complaint of abdominal pain and syncope. The patient had a syncopal episode last night while walking to her car after she began to feel flushed and diaphoretic while outside at a baseball game. She states that her friend observed her fall forward and she had LOC. She states that since then she feels that her "belly has dropped" and she has had to urinate a lot. She denies vaginal bleeding, discharge or LOF. She states some overall body aching including the lower abdomen. She also had a significant ankle injury that was addressed at another medical facility prior to arrival. The patient also states that she had CBC (Hgb 10), EKG (normal) and UA (normal) per patient at the other facility.   OB History    Gravida Para Term Preterm AB TAB SAB Ectopic Multiple Living   Past Medical History  Diagnosis Date  . HYPERTHYROIDISM 11/22/2006    Qualifier: Diagnosis of  By: Everardo All MD, Sean A     No past surgical history on file.  Family History  Problem Relation Age of Onset  . Diabetes Mother   . Heart disease Mother   . Diabetes Father   . Heart disease Father     Social History  Substance Use Topics  . Smoking status: Never Smoker   . Smokeless tobacco: Not on file  . Alcohol Use: Not on file    Allergies: No Known Allergies  Prescriptions prior to admission  Medication Sig Dispense Refill Last Dose  . Doxylamine-Pyridoxine (DICLEGIS) 10-10 MG TBEC Take 1 tablet by mouth at bedtime.   10/16/2014 at Unknown time  . famotidine (PEPCID) 20 MG tablet Take 20 mg by mouth at bedtime.   10/16/2014 at Unknown time  . folic acid (FOLVITE) 1 MG tablet Take 1 mg by mouth at bedtime.    10/16/2014 at Unknown time  . Prenatal Vit-Fe Fumarate-FA (PRENATAL MULTIVITAMIN) TABS tablet Take 1 tablet by mouth at bedtime.   Past Week at Unknown time  . propylthiouracil (PTU) 50 MG tablet Take 2 tablets (100 mg total) by mouth 3 (three) times daily. 180 tablet 2 10/16/2014 at Unknown time    Review of Systems  Constitutional: Negative for fever and malaise/fatigue.  Gastrointestinal: Positive for abdominal pain. Negative for nausea, vomiting, diarrhea and constipation.  Genitourinary: Negative for dysuria, urgency and frequency.  Neurological: Positive for loss of consciousness. Negative for dizziness.   Physical Exam   Blood pressure 122/66, pulse 102, temperature 98.7 F (37.1 C), temperature source Oral, resp. rate 16, height  (1.676 m), weight 161 lb (73.029 kg), last menstrual period 06/05/2014, SpO2 100 %.  Physical Exam  Nursing note and vitals reviewed. Constitutional: She is oriented to person, place, and time. She appears well-developed and well-nourished. No distress.  HENT:  Head: Normocephalic and atraumatic.  Cardiovascular: Normal rate.   Respiratory: Effort normal.  GI: Soft. She exhibits no distension and no mass. There is no tenderness. There is no rebound and no guarding.  Neurological: She is alert and oriented to person, place, and time.  Skin: Skin  is warm and dry. No erythema.  Psychiatric: She has a normal mood and affect.   Results for orders placed or performed during the hospital encounter of 10/17/14 (from the past 24 hour(s))  Glucose, capillary     Status: Abnormal   Collection Time: 10/17/14  8:31 PM  Result Value Ref Range   Glucose-Capillary 109 (H) 65 - 99 mg/dL    MAU Course  Procedures None  MDM Dr. Henderson Cloud called ahead of patient arrival to inform us of her reason for visit. She advised [place] for AFI, placenta and cervical length.  CBG, [place] today 2115 - patient in [place]. Care turned over to [facility], CNM Notified Dr Henderson Cloud of  U/S results   Marny Lowenstein, PA-C  10/17/2014, 9:16 PM   Assessment and Plan  Abdominal Pain in pregnancy  Discharge to home

## 2014-10-17 NOTE — MAU Note (Addendum)
Fainted last night and broke left ankle.  Was told to have OB ultrasound for cramping today.  Thought belly seemed lower and urinating a lot and not sure if loss any fluid.

## 2014-12-01 ENCOUNTER — Telehealth: Payer: Self-pay | Admitting: Endocrinology

## 2014-12-01 NOTE — Telephone Encounter (Signed)
Ov is needed in order to determine what medication, if any, you need

## 2014-12-01 NOTE — Telephone Encounter (Signed)
See note below. Pt stated the medication that was given to her by our office was only for the first trimester. Pt wanted to know if she should be taking anything during her second trimester.  Please advise, Thanks!

## 2014-12-01 NOTE — Telephone Encounter (Signed)
I contacted the pt and advised of note below. Patient scheduled for 12/03/14 at 2 pm.

## 2014-12-01 NOTE — Telephone Encounter (Signed)
Patient called stating that the medication Dr. Everardo All put her on is for the first trimester    Please advise patient   Thank you

## 2014-12-03 ENCOUNTER — Encounter: Payer: Self-pay | Admitting: Endocrinology

## 2014-12-03 ENCOUNTER — Ambulatory Visit (INDEPENDENT_AMBULATORY_CARE_PROVIDER_SITE_OTHER): Payer: BC Managed Care – PPO | Admitting: Endocrinology

## 2014-12-03 VITALS — BP 104/50 | HR 78 | Temp 98.8°F | Ht 68.0 in | Wt 161.0 lb

## 2014-12-03 DIAGNOSIS — E0590 Thyrotoxicosis, unspecified without thyrotoxic crisis or storm: Secondary | ICD-10-CM

## 2014-12-03 LAB — T4, FREE: FREE T4: 0.59 ng/dL — AB (ref 0.60–1.60)

## 2014-12-03 LAB — TSH: TSH: 3.4 u[IU]/mL (ref 0.35–4.50)

## 2014-12-03 NOTE — Telephone Encounter (Signed)
Patient stated that the pharmacy haven't received the medication, and she is totally out.

## 2014-12-03 NOTE — Telephone Encounter (Signed)
Pt called concerned about her thyroid medication. Patient has been with our her med for two days and is leaving to go out of town tomorrow. Pt is concerned she will be without her medication until she returns next Tuesday. Pt's flight leaves tomorrow at 7pm.

## 2014-12-03 NOTE — Telephone Encounter (Signed)
We expect to know your results well before your departure.

## 2014-12-03 NOTE — Progress Notes (Signed)
Subjective:    Patient ID: Connie [name], female    DOB: [birth year], 39 y.o.   MRN: [id number]  HPI Pt returns for f/u of hyperthyroidism, due to grave's dz (dx'ed 2008, on a routine blood test; scan was c/w grave's dz; she was scheduled for i-131 rx in 2008, but pregnancy test on that day was positive; she took tapazole for a few mos, but stopped in late 2015, in preparation for pregnancy).  She is now [redacted] weeks pregnant.  She takes PTU as rx'ed. Past Medical History  Diagnosis Date  . HYPERTHYROIDISM 11/22/2006    Qualifier: Diagnosis of  By: Everardo All MD, Cleophas Dunker   . Broken ankle 10/2014    left     Past Surgical History  Procedure Laterality Date  . No past surgeries      Social History   Social History  . Marital Status: Married    Spouse Name: N/A  . Number of Children: N/A  . Years of Education: N/A   Occupational History  . Not on file.   Social History Main Topics  . Smoking status: Never Smoker   . Smokeless tobacco: Not on file  . Alcohol Use: Not on file  . Drug Use: Not on file  . Sexual Activity: Not on file   Other Topics Concern  . Not on file   Social History Narrative    Current Outpatient Prescriptions on File Prior to Visit  Medication Sig Dispense Refill  . Doxylamine-Pyridoxine (DICLEGIS) 10-10 MG TBEC Take 1 tablet by mouth at bedtime.    . famotidine (PEPCID) 20 MG tablet Take 20 mg by mouth at bedtime.    . folic acid (FOLVITE) 1 MG tablet Take 1 mg by mouth at bedtime.    . Prenatal Vit-Fe Fumarate-FA (PRENATAL MULTIVITAMIN) TABS tablet Take 1 tablet by mouth at bedtime.     No current facility-administered medications on file prior to visit.    No Known Allergies  Family History  Problem Relation Age of Onset  . Diabetes Mother   . Heart disease Mother   . Diabetes Father   . Heart disease Father     BP 104/50 mmHg  Pulse 78  Temp(Src) 98.8 F (37.1 C) (Oral)  Ht 5\' 8"  (1.727 m)  Wt 161 lb (73.029 kg)  BMI 24.49 kg/m2   SpO2 98%  LMP 06/05/2014 (Approximate)   Review of Systems She denies fever    Objective:   Physical Exam VITAL SIGNS:  See vs page GENERAL: no distress head: no deformity eyes: no periorbital swelling, no proptosis external nose and ears are normal NECK: There is no palpable thyroid enlargement.  No thyroid nodule is palpable.   Nodes: No palpable lymphadenopathy at the anterior neck. LUNGS:  Clear to auscultation HEART:  Regular rate and rhythm without murmurs noted. Normal S1,S2.   Skin: not diaphoretic Neuro: no tremor.   Lab Results  Component Value Date   TSH 3.40 12/03/2014       Assessment & Plan:  Hyperthyroidism: euthyroid now, on medication pregnancy, new: in this setting, we'll err on the side of holding off on medication for now.  We have requested records from [facility].  Patient is advised the following: Patient Instructions  blood tests are being requested for you today.  We'll let you know about the results. Based on the results, we'll change the PTU to methimazole.   Please come back for a follow-up appointment in 1 month. if ever you  have fever while taking methimazole, stop it and call us, because of the risk of a rare side-effect.     Addendum: no medication is needed now.

## 2014-12-03 NOTE — Patient Instructions (Addendum)
blood tests are being requested for you today.  We'll let you know about the results. Based on the results, we'll change the PTU to methimazole.   Please come back for a follow-up appointment in 1 month. if ever you have fever while taking methimazole, stop it and call us, because of the risk of a rare side-effect.

## 2014-12-04 NOTE — Telephone Encounter (Signed)
I contacted the pt and advised the blood test from 9/29 shows no thyroid medication is needed at this time. Pt will return for a follow up in 1 month.

## 2014-12-16 LAB — OB RESULTS CONSOLE RPR: RPR: NONREACTIVE

## 2015-01-01 ENCOUNTER — Ambulatory Visit: Payer: BC Managed Care – PPO | Admitting: Endocrinology

## 2015-01-08 ENCOUNTER — Ambulatory Visit: Payer: BC Managed Care – PPO | Admitting: Endocrinology

## 2015-01-11 ENCOUNTER — Ambulatory Visit (INDEPENDENT_AMBULATORY_CARE_PROVIDER_SITE_OTHER): Payer: BC Managed Care – PPO | Admitting: Endocrinology

## 2015-01-11 ENCOUNTER — Encounter: Payer: Self-pay | Admitting: Endocrinology

## 2015-01-11 ENCOUNTER — Ambulatory Visit: Payer: BC Managed Care – PPO | Admitting: Endocrinology

## 2015-01-11 VITALS — BP 116/60 | HR 86 | Temp 98.1°F | Ht 68.0 in | Wt 171.0 lb

## 2015-01-11 DIAGNOSIS — E0590 Thyrotoxicosis, unspecified without thyrotoxic crisis or storm: Secondary | ICD-10-CM | POA: Diagnosis not present

## 2015-01-11 LAB — T4, FREE: Free T4: 0.53 ng/dL — ABNORMAL LOW (ref 0.60–1.60)

## 2015-01-11 LAB — TSH: TSH: 1.74 u[IU]/mL (ref 0.35–4.50)

## 2015-01-11 NOTE — Progress Notes (Signed)
   Subjective:    Patient ID: Connie [name], female    DOB: 03/22/1975, 39 y.o.   MRN: [id number]  HPI Pt returns for f/u of hyperthyroidism, due to grave's dz (dx'ed 2008, on a routine blood test; scan was c/w grave's dz; she was scheduled for i-131 rx in 2008, but pregnancy test on that day was positive; she took tapazole for a few mos, but stopped in late 2015, in preparation for pregnancy).  She is now [redacted] weeks pregnant.  PTU was stopped in late Sept, but she continues to take PRN for palpitations.  Since then, she has intermittent palpitations in the chest.  She has gained 20 lbs so far.   Past Medical History  Diagnosis Date  . HYPERTHYROIDISM 11/22/2006    Qualifier: Diagnosis of  By: Everardo All MD, Cleophas Dunker   . Broken ankle 10/2014    left     Past Surgical History  Procedure Laterality Date  . No past surgeries      Social History   Social History  . Marital Status: Married    Spouse Name: N/A  . Number of Children: N/A  . Years of Education: N/A   Occupational History  . Not on file.   Social History Main Topics  . Smoking status: Never Smoker   . Smokeless tobacco: Not on file  . Alcohol Use: Not on file  . Drug Use: Not on file  . Sexual Activity: Not on file   Other Topics Concern  . Not on file   Social History Narrative    Current Outpatient Prescriptions on File Prior to Visit  Medication Sig Dispense Refill  . Doxylamine-Pyridoxine (DICLEGIS) 10-10 MG TBEC Take 1 tablet by mouth at bedtime.    . famotidine (PEPCID) 20 MG tablet Take 20 mg by mouth at bedtime.    . folic acid (FOLVITE) 1 MG tablet Take 1 mg by mouth at bedtime.    . Prenatal Vit-Fe Fumarate-FA (PRENATAL MULTIVITAMIN) TABS tablet Take 1 tablet by mouth at bedtime.     No current facility-administered medications on file prior to visit.    No Known Allergies  Family History  Problem Relation Age of Onset  . Diabetes Mother   . Heart disease Mother   . Diabetes Father   . Heart  disease Father     BP 116/60 mmHg  Pulse 86  Temp(Src) 98.1 F (36.7 C) (Oral)  Ht 5\' 8"  (1.727 m)  Wt 171 lb (77.565 kg)  BMI 26.01 kg/m2  SpO2 96%  LMP 06/05/2014 (Approximate)  Review of Systems Denies fever.      Objective:   Physical Exam VITAL SIGNS:  See vs page GENERAL: no distress NECK: There is no palpable thyroid enlargement.  No thyroid nodule is palpable.  No palpable lymphadenopathy at the anterior neck.     Lab Results  Component Value Date   TSH 1.74 01/11/2015      Assessment & Plan:  Hyperthyroidism: in the context of pregnancy, she should avoid medication for this.   Patient is advised the following: Patient Instructions  blood tests are being requested for you today.  We'll let you know about the results. Please stay off the thyroid medication.   Please come back for a follow-up appointment in 1 month.

## 2015-01-11 NOTE — Patient Instructions (Addendum)
blood tests are being requested for you today.  We'll let you know about the results. Please stay off the thyroid medication.   Please come back for a follow-up appointment in 1 month.

## 2015-01-13 ENCOUNTER — Encounter (HOSPITAL_COMMUNITY): Payer: Self-pay | Admitting: *Deleted

## 2015-01-13 ENCOUNTER — Inpatient Hospital Stay (HOSPITAL_COMMUNITY)
Admission: AD | Admit: 2015-01-13 | Discharge: 2015-01-13 | Disposition: A | Discharge status: Home / Self Care | Payer: BC Managed Care – PPO | Source: Ambulatory Visit | Attending: Obstetrics | Admitting: Obstetrics

## 2015-01-13 DIAGNOSIS — Z3A31 31 weeks gestation of pregnancy: Secondary | ICD-10-CM | POA: Insufficient documentation

## 2015-01-13 DIAGNOSIS — O30043 Twin pregnancy, dichorionic/diamniotic, third trimester: Secondary | ICD-10-CM | POA: Diagnosis not present

## 2015-01-13 LAB — FETAL FIBRONECTIN: FETAL FIBRONECTIN: POSITIVE — AB

## 2015-01-13 MED ORDER — BETAMETHASONE SOD PHOS & ACET 6 (3-3) MG/ML IJ SUSP
12.0000 mg | Freq: Once | INTRAMUSCULAR | Status: AC
  Administered 2015-01-13: 12 mg via INTRAMUSCULAR
  Filled 2015-01-13: qty 2

## 2015-01-13 NOTE — Progress Notes (Signed)
39 yo G2P1 @ [redacted]w[redacted]d w di-di twins.  Was seen in the office today for routine [place]. Fetal growth was normal, but the patient was noted to have a 22mm cervix.  On SVE, she was 1/100/-1.  Patient presented to MAU for admission for observation for TPTL.  I was called to speak with the patient because she is declining admission.    Since arrival, she denies all contractions, cramping, vaginal bleeding.    Past Medical History  Diagnosis Date  . HYPERTHYROIDISM 11/22/2006    Qualifier: Diagnosis of  By: Everardo All MD, Cleophas Dunker   . Broken ankle 10/2014    left    Past Surgical History  Procedure Laterality Date  . No past surgeries     Toco: rare ctx EFM:  A+B, Cat 1, reactive, no decels  I had a long discussion with the patient regarding the findings on ultrasound, the increased risk of preterm labor due to twin gestation, and the risks of delivering at this early gestational age.  She is aware of all of the above.  She did receive a BMZ injection in MAU, but declined admission to antepartum.  She agreed to return immediately for > 6 ctx / hr, LOF, VB, or decreased fetal movement.  She was advised to stay out of work for the next week.  She is aware that she is electing to leave against our recommended medical advice. She does agree to return to MAU for repeat BMZ injection tomorrow.

## 2015-01-13 NOTE — Discharge Instructions (Signed)
Keep your scheduled appointment for prenatal care. Return tomorrow around 5PM for 2nd injection of Betamethasone. Drink 8-10 glasses of water per day.

## 2015-01-14 ENCOUNTER — Encounter (HOSPITAL_COMMUNITY): Payer: Self-pay

## 2015-01-14 ENCOUNTER — Observation Stay (HOSPITAL_COMMUNITY)
Admission: AD | Admit: 2015-01-14 | Discharge: 2015-01-15 | Disposition: A | Discharge status: Home / Self Care | Payer: BC Managed Care – PPO | Source: Ambulatory Visit | Attending: Obstetrics and Gynecology | Admitting: Obstetrics and Gynecology

## 2015-01-14 DIAGNOSIS — O09899 Supervision of other high risk pregnancies, unspecified trimester: Secondary | ICD-10-CM

## 2015-01-14 DIAGNOSIS — Z3A31 31 weeks gestation of pregnancy: Secondary | ICD-10-CM | POA: Insufficient documentation

## 2015-01-14 DIAGNOSIS — R8789 Other abnormal findings in specimens from female genital organs: Secondary | ICD-10-CM

## 2015-01-14 DIAGNOSIS — IMO0001 Reserved for inherently not codable concepts without codable children: Secondary | ICD-10-CM

## 2015-01-14 DIAGNOSIS — O30043 Twin pregnancy, dichorionic/diamniotic, third trimester: Secondary | ICD-10-CM | POA: Insufficient documentation

## 2015-01-14 LAB — TYPE AND SCREEN
ABO/RH(D): O POS
ANTIBODY SCREEN: NEGATIVE

## 2015-01-14 LAB — ABO/RH: ABO/RH(D): O POS

## 2015-01-14 LAB — GROUP B STREP BY PCR: Group B strep by PCR: NEGATIVE

## 2015-01-14 MED ORDER — ZOLPIDEM TARTRATE 5 MG PO TABS: 5.0000 mg | ORAL_TABLET | Freq: Every evening | ORAL | Status: DC | PRN

## 2015-01-14 MED ORDER — DOCUSATE SODIUM 100 MG PO CAPS: 100.0000 mg | ORAL_CAPSULE | Freq: Every day | ORAL | Status: DC

## 2015-01-14 MED ORDER — ACETAMINOPHEN 325 MG PO TABS: 650.0000 mg | ORAL_TABLET | ORAL | Status: DC | PRN

## 2015-01-14 MED ORDER — PRENATAL MULTIVITAMIN CH: 1.0000 | ORAL_TABLET | Freq: Every day | ORAL | Status: DC

## 2015-01-14 MED ORDER — CALCIUM CARBONATE ANTACID 500 MG PO CHEW: 2.0000 | CHEWABLE_TABLET | ORAL | Status: DC | PRN

## 2015-01-14 MED ORDER — BETAMETHASONE SOD PHOS & ACET 6 (3-3) MG/ML IJ SUSP
12.0000 mg | Freq: Once | INTRAMUSCULAR | Status: AC
  Administered 2015-01-14: 12 mg via INTRAMUSCULAR
  Filled 2015-01-14: qty 2

## 2015-01-14 NOTE — H&P (Addendum)
39 y.o. [redacted]w[redacted]d  G2P1001 comes in for second dose of betamethasone.  Yesterday pt was send to mAU from office after growth scan revealed di/di pregnancy vtx/transverse A 1909>B 1770g with no measurable cervix and os dilated to 6mm internally and 4mm externally, BPPs 8/8 x 2. FFN was positive. After receiving first dose of betamethsone pt refused admission and left AMA. Today she was again counseled about the risks assoc with her cervical status and agreed to admission for monitoring.  Otherwise has good fetal movement and no bleeding.  Past Medical History  Diagnosis Date  . HYPERTHYROIDISM 11/22/2006    Qualifier: Diagnosis of  By: Everardo All MD, Cleophas Dunker   . Broken ankle 10/2014    left     Past Surgical History  Procedure Laterality Date  . No past surgeries      OB History  Gravida Para Term Preterm AB SAB TAB Ectopic Multiple Living  # Outcome Date GA Lbr Len/2nd Weight Sex Delivery Anes PTL Lv  2 Current           1 Term      Vag-Spont         Social History   Social History  . Marital Status: Married    Spouse Name: N/A  . Number of Children: N/A  . Years of Education: N/A   Occupational History  . Not on file.   Social History Main Topics  . Smoking status: Never Smoker   . Smokeless tobacco: Never Used  . Alcohol Use: No  . Drug Use: No  . Sexual Activity: Not Currently   Other Topics Concern  . Not on file   Social History Narrative   Adhesive    Prenatal Transfer Tool  Maternal Diabetes: No Genetic Screening: Normal Maternal Ultrasounds/Referrals: Normal Fetal Ultrasounds or other Referrals:  None Maternal Substance Abuse:  No Significant Maternal Medications:  None Significant Maternal Lab Results: none, rapid strep obtained  Other PNC: Grave's Disease on medication. TSH 1.74 on 01/11/2015    Filed Vitals:   01/14/15 1641  BP: 106/61  Pulse: 88  Resp: 16     Lungs/Cor:  NAD Abdomen:  soft, gravid Ex:  no cords, erythema SVE:   1.5/ FHTs:  135/160, good STV, NST R Toco:  occ   A/P   Admission for observation  GBS pending  Cont. Toco, NST q shift  Dinner with clear liquird diet overnight until reassessment in am  Can consider MFM consultation tomorrow.  Philip Aspen

## 2015-01-14 NOTE — MAU Provider Note (Signed)
History     CSN: [id number]  Arrival date and time: 01/14/15 1618   First Provider Initiated Contact with Patient 01/14/15 1721      No chief complaint on file.  HPI   Ms.Connie [name] is a. 39 y.o. female G2P1001 at [redacted]w[redacted]d twin gestaion here for her second dose of BMTZ; the patient was seen yesterday and had a positive fetal fibronectin. She was noted to have a shortened cervix at her prenatal appointment (6 mm), She left against medication advice last night after Dr. Chestine Spore recommended admission for observation. She was placed on bedrest at home and sent home with strict preterm labor precautions.  She continued to have lower abdominal cramping last night, however the pain subsided this morning. She denies pain at this time.   She is not interested in being admitted.   She denies leaking of fluid She denies bleeding + fetal movement.   OB History    Gravida Para Term Preterm AB TAB SAB Ectopic Multiple Living   Past Medical History  Diagnosis Date  . HYPERTHYROIDISM 11/22/2006    Qualifier: Diagnosis of  By: Everardo All MD, Cleophas Dunker   . Broken ankle 10/2014    left     Past Surgical History  Procedure Laterality Date  . No past surgeries      Family History  Problem Relation Age of Onset  . Diabetes Mother   . Heart disease Mother   . Diabetes Father   . Heart disease Father     Social History  Substance Use Topics  . Smoking status: Never Smoker   . Smokeless tobacco: Never Used  . Alcohol Use: No    Allergies:  Allergies  Allergen Reactions  . Adhesive [Tape] Rash    Paper tape is fine    Prescriptions prior to admission  Medication Sig Dispense Refill Last Dose  . Doxylamine-Pyridoxine (DICLEGIS) 10-10 MG TBEC Take 1 tablet by mouth at bedtime.   01/12/2015 at Unknown time  . famotidine (PEPCID) 20 MG tablet Take 20 mg by mouth at bedtime.   01/12/2015 at Unknown time  . folic acid (FOLVITE) 1 MG tablet Take 1 mg by mouth at bedtime.    01/12/2015 at Unknown time  . Prenatal Vit-Fe Fumarate-FA (PRENATAL MULTIVITAMIN) TABS tablet Take 1 tablet by mouth at bedtime.   01/12/2015 at Unknown time   Results for orders placed or performed during the hospital encounter of 01/13/15 (from the past 48 hour(s))  Fetal fibronectin     Status: Abnormal   Collection Time: 01/13/15  4:20 PM  Result Value Ref Range   Fetal Fibronectin POSITIVE (A) NEGATIVE    Review of Systems  Constitutional: Negative for fever and chills.  Gastrointestinal: Negative for nausea, vomiting and abdominal pain.   Physical Exam   Blood pressure 106/61, pulse 88, resp. rate 16, last menstrual period 06/05/2014.  Physical Exam  Constitutional: She is oriented to person, place, and time. She appears well-developed and well-nourished. No distress.  HENT:  Head: Normocephalic.  Respiratory: Effort normal.  GI: Soft. There is no tenderness.  Genitourinary:   Cervix: 1.5 cm, 100%, -1  Musculoskeletal: Normal range of motion.  Neurological: She is alert and oriented to person, place, and time.  Skin: Skin is warm. She is not diaphoretic.  Psychiatric: Her behavior is normal.    Fetal Tracing: reactive, fetus A and B.  2 contractions in 45  mins noted with UI.   MAU Course  Procedures  None  MDM  Discussed patient with Dr. Claiborne Billings who again recommends the patient be admitted to [facility] for observation for preterm labor>+ fetal fibronectin> shortened cervical length.  Patient agrees to admission for observation Betamethasone given X 2 doses   Assessment and Plan   A:  1. Preterm labor in third trimester without delivery   2. Positive fetal fibronectin at 22 weeks to [redacted] weeks gestation    P:  Admit for observation BMTZ completed MFM consult per Dr. Alma Downs continuous  NST Q shift.     Duane Lope, NP 01/14/2015 6:06 PM

## 2015-01-14 NOTE — MAU Note (Signed)
Pt seen in MAU yesterday, back for 2nd BMZ shot today.  Pt has short cervix & twins, was dilated 1 cm yesterday.  Had increased pain last night, is somewhat nervous about this today.  Pt lives in [place] & thinks she may need to be evaluated today.  Denies bleeding or LOF.  Having uc's but is unsure of how many in an hour.

## 2015-01-14 NOTE — MAU Note (Signed)
Connie [name] reviewed strip and said it was reactive so patient was okay to be off the monitor until she was admitted.

## 2015-01-15 ENCOUNTER — Observation Stay (HOSPITAL_COMMUNITY)
Admit: 2015-01-15 | Discharge: 2015-01-15 | Disposition: A | Discharge status: Home / Self Care | Payer: BC Managed Care – PPO | Attending: Obstetrics and Gynecology | Admitting: Obstetrics and Gynecology

## 2015-01-15 ENCOUNTER — Ambulatory Visit (HOSPITAL_COMMUNITY): Payer: BC Managed Care – PPO

## 2015-01-15 NOTE — Discharge Instructions (Signed)

## 2015-01-15 NOTE — Progress Notes (Signed)
Patient ID: Connie [name], female   DOB: 11-08-1975, 39 y.o.   MRN: [id number]   HD#2  39 yo G2P1001 @ 31+4 with Di/Di twins admitted for increased risk of PTD and short cervix  S: Comfortable, active fetal movement. No significant contractions O:  Filed Vitals:   01/14/15 2025 01/15/15 0000 01/15/15 0700 01/15/15 0759  BP: 122/63 89/39 116/65 115/65  Pulse: 100 92 86 81  Temp: 98.2 F (36.8 C) 98.2 F (36.8 C) 98.2 F (36.8 C) 97.9 F (36.6 C)  TempSrc: Oral  Oral Oral  Resp: Height:      Weight:       GEN: AOX3 Abd: Soft, NT/ND FHR: 150s x 2, cat 1 tracing Cvx: deferred (1-2 on admission) Toco: uterine irritability  A/P:  1) Continue hospital management at this time 2) Will have completed steroid time this evening 3) Will obtain MFM consult to determine if continued hospital admission is recommended and if magnesium sulfate administration is recommended 4) SCDs for DVT prophylaxis

## 2015-01-15 NOTE — Progress Notes (Signed)
Discussed discharge instructions with patient. Educated patient on preterm labor symptoms. DC patient to home

## 2015-01-15 NOTE — Progress Notes (Signed)
MFM Consult for continue hospital admission? Magnesium sulphate  39 year old, G2P1 at 88 weeks 4 days gestation with dichorionic diamniotic twin gestation. She notes that has been feeling fine and had not unusual symptoms leading up to her visit yesterday with the ultrasound finding of ~1/2 cm dilation with no cervical length noted (ultrasound images not available for review. Specifically, she notes to me active fetal movement x2 with no uterine cramping/contractions, vaginal bleeding/spotting or ROM leading up to her visit or since she has been in observation status.   Toco reported as occasional contractions Ultrasound reported as cephalic/transverse with EFW of 1909 and 1770 respectively. Currently getting betamethasone in the hospital under observation status.   Agree with discharge to home with weekly follow-up in the office. Would not use magnesium as delivery does not appear to be pending or imminent.   Questions appear answered for the couple and precautions for the above given. Spent greater than 1/2 of 20 minute visit face to face counseling

## 2015-02-03 NOTE — Discharge Summary (Signed)
Obstetric Discharge Summary Reason for Admission: preterm contractions Prenatal Procedures: ultrasound Intrapartum Procedures: NA Postpartum Procedures: NA Complications-Operative and Postpartum: NA HEMOGLOBIN  Date Value Ref Range Status  09/30/2014 9.6 g/dL Final  83/15/1761 60.7 DELTA CHECK NOTED*  Final   HCT  Date Value Ref Range Status  08/21/2014 28 % Final  05/31/2007 30.9*  Final    Physical Exam:  General: alert, cooperative and appears stated age NST reactive x 2, category 1 tracing  Discharge Diagnoses: Premature labor  Discharge Information: Date: 02/03/2015 Activity: pelvic rest Diet: routine Medications: PNV Condition: stable Instructions: refer to practice specific booklet Discharge to: home    Connie [name] H. 02/03/2015, 10:37 AM

## 2015-02-10 ENCOUNTER — Ambulatory Visit: Payer: BC Managed Care – PPO | Admitting: Endocrinology

## 2015-02-10 ENCOUNTER — Other Ambulatory Visit: Payer: Self-pay | Admitting: Obstetrics

## 2015-02-10 LAB — OB RESULTS CONSOLE GBS: STREP GROUP B AG: NEGATIVE

## 2015-02-22 ENCOUNTER — Other Ambulatory Visit: Payer: Self-pay | Admitting: Obstetrics and Gynecology

## 2015-02-23 ENCOUNTER — Encounter (HOSPITAL_COMMUNITY): Payer: Self-pay | Admitting: *Deleted

## 2015-02-23 ENCOUNTER — Telehealth (HOSPITAL_COMMUNITY): Payer: Self-pay | Admitting: *Deleted

## 2015-02-23 NOTE — Telephone Encounter (Signed)
Preadmission screen  

## 2015-02-24 ENCOUNTER — Inpatient Hospital Stay (HOSPITAL_COMMUNITY)
Admission: RE | Admit: 2015-02-24 | Discharge: 2015-02-26 | DRG: 775 | Disposition: A | Discharge status: Home / Self Care | Payer: BC Managed Care – PPO | Source: Ambulatory Visit | Attending: Obstetrics and Gynecology | Admitting: Obstetrics and Gynecology

## 2015-02-24 ENCOUNTER — Inpatient Hospital Stay (HOSPITAL_COMMUNITY): Payer: BC Managed Care – PPO | Admitting: Anesthesiology

## 2015-02-24 ENCOUNTER — Encounter (HOSPITAL_COMMUNITY)
Admission: RE | Disposition: A | Discharge status: Home / Self Care | Payer: Self-pay | Source: Ambulatory Visit | Attending: Obstetrics and Gynecology

## 2015-02-24 ENCOUNTER — Encounter (HOSPITAL_COMMUNITY): Payer: Self-pay

## 2015-02-24 DIAGNOSIS — Z3A37 37 weeks gestation of pregnancy: Secondary | ICD-10-CM | POA: Diagnosis not present

## 2015-02-24 DIAGNOSIS — O09523 Supervision of elderly multigravida, third trimester: Secondary | ICD-10-CM | POA: Diagnosis not present

## 2015-02-24 DIAGNOSIS — D649 Anemia, unspecified: Secondary | ICD-10-CM | POA: Diagnosis present

## 2015-02-24 DIAGNOSIS — Z3490 Encounter for supervision of normal pregnancy, unspecified, unspecified trimester: Secondary | ICD-10-CM

## 2015-02-24 DIAGNOSIS — O9081 Anemia of the puerperium: Secondary | ICD-10-CM | POA: Diagnosis present

## 2015-02-24 DIAGNOSIS — O30043 Twin pregnancy, dichorionic/diamniotic, third trimester: Secondary | ICD-10-CM | POA: Diagnosis present

## 2015-02-24 DIAGNOSIS — O321XX2 Maternal care for breech presentation, fetus 2: Principal | ICD-10-CM | POA: Diagnosis present

## 2015-02-24 LAB — CBC
HCT: 30.6 % — ABNORMAL LOW (ref 36.0–46.0)
Hemoglobin: 10 g/dL — ABNORMAL LOW (ref 12.0–15.0)
MCH: 26.5 pg (ref 26.0–34.0)
MCHC: 32.7 g/dL (ref 30.0–36.0)
MCV: 81 fL (ref 78.0–100.0)
PLATELETS: 217 10*3/uL (ref 150–400)
RBC: 3.78 MIL/uL — ABNORMAL LOW (ref 3.87–5.11)
RDW: 16.4 % — AB (ref 11.5–15.5)
WBC: 6.8 10*3/uL (ref 4.0–10.5)

## 2015-02-24 LAB — TYPE AND SCREEN
ABO/RH(D): O POS
Antibody Screen: NEGATIVE

## 2015-02-24 LAB — RPR: RPR: NONREACTIVE

## 2015-02-24 SURGERY — Surgical Case
Anesthesia: Epidural

## 2015-02-24 MED ORDER — EPHEDRINE 5 MG/ML INJ: 10.0000 mg | INTRAVENOUS | Status: DC | PRN

## 2015-02-24 MED ORDER — TERBUTALINE SULFATE 1 MG/ML IJ SOLN: 0.2500 mg | Freq: Once | INTRAMUSCULAR | Status: DC | PRN

## 2015-02-24 MED ORDER — ACETAMINOPHEN 325 MG PO TABS: 650.0000 mg | ORAL_TABLET | ORAL | Status: DC | PRN

## 2015-02-24 MED ORDER — IBUPROFEN 800 MG PO TABS
800.0000 mg | ORAL_TABLET | Freq: Three times a day (TID) | ORAL | Status: DC
  Administered 2015-02-24 – 2015-02-26 (×4): 800 mg via ORAL
  Filled 2015-02-24 (×5): qty 1

## 2015-02-24 MED ORDER — LACTATED RINGERS IV SOLN
INTRAVENOUS | Status: DC
  Administered 2015-02-24 (×2): via INTRAVENOUS

## 2015-02-24 MED ORDER — PRENATAL MULTIVITAMIN CH
1.0000 | ORAL_TABLET | Freq: Every day | ORAL | Status: DC
  Administered 2015-02-25: 1 via ORAL
  Filled 2015-02-24 (×2): qty 1

## 2015-02-24 MED ORDER — SODIUM CHLORIDE 0.9 % IJ SOLN: 3.0000 mL | INTRAMUSCULAR | Status: DC | PRN

## 2015-02-24 MED ORDER — OXYCODONE-ACETAMINOPHEN 5-325 MG PO TABS
2.0000 | ORAL_TABLET | ORAL | Status: DC | PRN
  Filled 2015-02-24 (×2): qty 2

## 2015-02-24 MED ORDER — OXYTOCIN 40 UNITS IN LACTATED RINGERS INFUSION - SIMPLE MED
1.0000 m[IU]/min | INTRAVENOUS | Status: DC
  Administered 2015-02-24: 2 m[IU]/min via INTRAVENOUS
  Filled 2015-02-24: qty 1000

## 2015-02-24 MED ORDER — SODIUM BICARBONATE 8.4 % IV SOLN
INTRAVENOUS | Status: DC | PRN
  Administered 2015-02-24: 5 mL via EPIDURAL

## 2015-02-24 MED ORDER — FENTANYL 2.5 MCG/ML BUPIVACAINE 1/10 % EPIDURAL INFUSION (WH - ANES)
14.0000 mL/h | INTRAMUSCULAR | Status: DC | PRN
  Administered 2015-02-24: 14 mL/h via EPIDURAL
  Filled 2015-02-24 (×2): qty 125

## 2015-02-24 MED ORDER — OXYCODONE-ACETAMINOPHEN 5-325 MG PO TABS: 2.0000 | ORAL_TABLET | ORAL | Status: DC | PRN

## 2015-02-24 MED ORDER — METHYLERGONOVINE MALEATE 0.2 MG PO TABS: 0.2000 mg | ORAL_TABLET | ORAL | Status: DC | PRN

## 2015-02-24 MED ORDER — MAGNESIUM HYDROXIDE 400 MG/5ML PO SUSP: 30.0000 mL | ORAL | Status: DC | PRN

## 2015-02-24 MED ORDER — SODIUM CHLORIDE 0.9 % IV SOLN: 250.0000 mL | INTRAVENOUS | Status: DC | PRN

## 2015-02-24 MED ORDER — BUTORPHANOL TARTRATE 1 MG/ML IJ SOLN
1.0000 mg | INTRAMUSCULAR | Status: DC | PRN
  Administered 2015-02-24: 1 mg via INTRAVENOUS

## 2015-02-24 MED ORDER — BENZOCAINE-MENTHOL 20-0.5 % EX AERO
1.0000 "application " | INHALATION_SPRAY | CUTANEOUS | Status: DC | PRN
  Administered 2015-02-24: 1 via TOPICAL
  Filled 2015-02-24 (×2): qty 56

## 2015-02-24 MED ORDER — OXYTOCIN BOLUS FROM INFUSION
500.0000 mL | INTRAVENOUS | Status: DC
  Administered 2015-02-24: 500 mL via INTRAVENOUS

## 2015-02-24 MED ORDER — LIDOCAINE HCL (PF) 1 % IJ SOLN
INTRAMUSCULAR | Status: DC | PRN
  Administered 2015-02-24 (×2): 4 mL

## 2015-02-24 MED ORDER — LACTATED RINGERS IV SOLN
500.0000 mL | INTRAVENOUS | Status: DC | PRN
  Administered 2015-02-24 (×2): 1000 mL via INTRAVENOUS

## 2015-02-24 MED ORDER — BUTORPHANOL TARTRATE 1 MG/ML IJ SOLN
INTRAMUSCULAR | Status: AC
  Administered 2015-02-24: 1 mg via INTRAVENOUS
  Filled 2015-02-24: qty 1

## 2015-02-24 MED ORDER — METHYLERGONOVINE MALEATE 0.2 MG/ML IJ SOLN: 0.2000 mg | INTRAMUSCULAR | Status: DC | PRN

## 2015-02-24 MED ORDER — CITRIC ACID-SODIUM CITRATE 334-500 MG/5ML PO SOLN: 30.0000 mL | ORAL | Status: DC | PRN

## 2015-02-24 MED ORDER — SENNOSIDES-DOCUSATE SODIUM 8.6-50 MG PO TABS
2.0000 | ORAL_TABLET | ORAL | Status: DC
Start: 2015-02-25 — End: 2015-02-26
  Filled 2015-02-24: qty 2

## 2015-02-24 MED ORDER — MEASLES, MUMPS & RUBELLA VAC ~~LOC~~ INJ
0.5000 mL | INJECTION | Freq: Once | SUBCUTANEOUS | Status: DC
  Filled 2015-02-24: qty 0.5

## 2015-02-24 MED ORDER — ZOLPIDEM TARTRATE 5 MG PO TABS
5.0000 mg | ORAL_TABLET | Freq: Every evening | ORAL | Status: DC | PRN
Start: 2015-02-24 — End: 2015-02-26

## 2015-02-24 MED ORDER — TETANUS-DIPHTH-ACELL PERTUSSIS 5-2.5-18.5 LF-MCG/0.5 IM SUSP
0.5000 mL | Freq: Once | INTRAMUSCULAR | Status: DC
  Filled 2015-02-24: qty 0.5

## 2015-02-24 MED ORDER — SODIUM CHLORIDE 0.9 % IJ SOLN: 3.0000 mL | Freq: Two times a day (BID) | INTRAMUSCULAR | Status: DC

## 2015-02-24 MED ORDER — FLEET ENEMA 7-19 GM/118ML RE ENEM: 1.0000 | ENEMA | RECTAL | Status: DC | PRN

## 2015-02-24 MED ORDER — LANOLIN HYDROUS EX OINT: TOPICAL_OINTMENT | CUTANEOUS | Status: DC | PRN

## 2015-02-24 MED ORDER — ONDANSETRON HCL 4 MG/2ML IJ SOLN: 4.0000 mg | Freq: Four times a day (QID) | INTRAMUSCULAR | Status: DC | PRN

## 2015-02-24 MED ORDER — LIDOCAINE HCL (PF) 1 % IJ SOLN
30.0000 mL | INTRAMUSCULAR | Status: AC | PRN
  Administered 2015-02-24: 30 mL via SUBCUTANEOUS
  Filled 2015-02-24: qty 30

## 2015-02-24 MED ORDER — OXYTOCIN 10 UNIT/ML IJ SOLN
40.0000 [IU] | INTRAVENOUS | Status: DC | PRN
  Administered 2015-02-24: 40 [IU] via INTRAVENOUS

## 2015-02-24 MED ORDER — DIPHENHYDRAMINE HCL 50 MG/ML IJ SOLN
12.5000 mg | INTRAMUSCULAR | Status: DC | PRN
  Administered 2015-02-24: 12.5 mg via INTRAVENOUS
  Filled 2015-02-24: qty 1

## 2015-02-24 MED ORDER — ONDANSETRON HCL 4 MG/2ML IJ SOLN: 4.0000 mg | INTRAMUSCULAR | Status: DC | PRN

## 2015-02-24 MED ORDER — DIPHENHYDRAMINE HCL 25 MG PO CAPS: 25.0000 mg | ORAL_CAPSULE | Freq: Four times a day (QID) | ORAL | Status: DC | PRN

## 2015-02-24 MED ORDER — DIBUCAINE 1 % RE OINT
1.0000 "application " | TOPICAL_OINTMENT | RECTAL | Status: DC | PRN
  Administered 2015-02-25: 1 via RECTAL
  Filled 2015-02-24 (×2): qty 28

## 2015-02-24 MED ORDER — ONDANSETRON HCL 4 MG PO TABS
4.0000 mg | ORAL_TABLET | ORAL | Status: DC | PRN
  Administered 2015-02-24 – 2015-02-26 (×4): 4 mg via ORAL
  Filled 2015-02-24 (×4): qty 1

## 2015-02-24 MED ORDER — SIMETHICONE 80 MG PO CHEW: 80.0000 mg | CHEWABLE_TABLET | ORAL | Status: DC | PRN

## 2015-02-24 MED ORDER — OXYTOCIN 40 UNITS IN LACTATED RINGERS INFUSION - SIMPLE MED
62.5000 mL/h | INTRAVENOUS | Status: DC
  Administered 2015-02-24: 62.5 mL/h via INTRAVENOUS

## 2015-02-24 MED ORDER — OXYCODONE-ACETAMINOPHEN 5-325 MG PO TABS: 1.0000 | ORAL_TABLET | ORAL | Status: DC | PRN

## 2015-02-24 MED ORDER — FERROUS SULFATE 325 (65 FE) MG PO TABS
325.0000 mg | ORAL_TABLET | Freq: Two times a day (BID) | ORAL | Status: DC
  Administered 2015-02-24 – 2015-02-26 (×4): 325 mg via ORAL
  Filled 2015-02-24 (×5): qty 1

## 2015-02-24 MED ORDER — WITCH HAZEL-GLYCERIN EX PADS
1.0000 "application " | MEDICATED_PAD | CUTANEOUS | Status: DC | PRN
  Administered 2015-02-25: 1 via TOPICAL

## 2015-02-24 MED ORDER — OXYCODONE-ACETAMINOPHEN 5-325 MG PO TABS
1.0000 | ORAL_TABLET | ORAL | Status: DC | PRN
  Administered 2015-02-24 – 2015-02-25 (×5): 1 via ORAL
  Filled 2015-02-24 (×5): qty 1

## 2015-02-24 MED ORDER — PHENYLEPHRINE 40 MCG/ML (10ML) SYRINGE FOR IV PUSH (FOR BLOOD PRESSURE SUPPORT)
80.0000 ug | PREFILLED_SYRINGE | INTRAVENOUS | Status: DC | PRN
  Filled 2015-02-24: qty 20

## 2015-02-24 SURGICAL SUPPLY — 35 items
APL SKNCLS STERI-STRIP NONHPOA (GAUZE/BANDAGES/DRESSINGS) ×2
BENZOIN TINCTURE PRP APPL 2/3 (GAUZE/BANDAGES/DRESSINGS) ×4 IMPLANT
CLAMP CORD UMBIL (MISCELLANEOUS) IMPLANT
CLOSURE WOUND 1/2 X4 (GAUZE/BANDAGES/DRESSINGS) ×1
CLOTH BEACON ORANGE TIMEOUT ST (SAFETY) ×4 IMPLANT
DRAPE SHEET LG 3/4 BI-LAMINATE (DRAPES) IMPLANT
DRSG OPSITE POSTOP 4X10 (GAUZE/BANDAGES/DRESSINGS) ×4 IMPLANT
DURAPREP 26ML APPLICATOR (WOUND CARE) ×4 IMPLANT
ELECT REM PT RETURN 9FT ADLT (ELECTROSURGICAL) ×4
ELECTRODE REM PT RTRN 9FT ADLT (ELECTROSURGICAL) ×2 IMPLANT
EXTRACTOR VACUUM BELL STYLE (SUCTIONS) IMPLANT
GLOVE BIO SURGEON STRL SZ7 (GLOVE) ×4 IMPLANT
GLOVE BIOGEL PI IND STRL 7.0 (GLOVE) ×2 IMPLANT
GLOVE BIOGEL PI INDICATOR 7.0 (GLOVE) ×2
GOWN STRL REUS W/TWL LRG LVL3 (GOWN DISPOSABLE) ×8 IMPLANT
KIT ABG SYR 3ML LUER SLIP (SYRINGE) IMPLANT
NDL HYPO 25X5/8 SAFETYGLIDE (NEEDLE) IMPLANT
NEEDLE HYPO 25X5/8 SAFETYGLIDE (NEEDLE) IMPLANT
NS IRRIG 1000ML POUR BTL (IV SOLUTION) ×4 IMPLANT
PACK C SECTION WH (CUSTOM PROCEDURE TRAY) ×4 IMPLANT
PAD OB MATERNITY 4.3X12.25 (PERSONAL CARE ITEMS) ×4 IMPLANT
PENCIL SMOKE EVAC W/HOLSTER (ELECTROSURGICAL) ×4 IMPLANT
RTRCTR C-SECT PINK 25CM LRG (MISCELLANEOUS) ×4 IMPLANT
STRIP CLOSURE SKIN 1/2X4 (GAUZE/BANDAGES/DRESSINGS) ×3 IMPLANT
SUT MNCRL 0 VIOLET CTX 36 (SUTURE) ×4 IMPLANT
SUT MONOCRYL 0 CTX 36 (SUTURE) ×4
SUT PDS AB 0 CTX 60 (SUTURE) IMPLANT
SUT PLAIN 2 0 XLH (SUTURE) IMPLANT
SUT VIC AB 0 CT1 27 (SUTURE) ×8
SUT VIC AB 0 CT1 27XBRD ANBCTR (SUTURE) ×4 IMPLANT
SUT VIC AB 2-0 CT1 27 (SUTURE) ×4
SUT VIC AB 2-0 CT1 TAPERPNT 27 (SUTURE) ×2 IMPLANT
SUT VIC AB 4-0 KS 27 (SUTURE) ×4 IMPLANT
TOWEL OR 17X24 6PK STRL BLUE (TOWEL DISPOSABLE) ×4 IMPLANT
TRAY FOLEY CATH SILVER 14FR (SET/KITS/TRAYS/PACK) ×4 IMPLANT

## 2015-02-24 NOTE — Progress Notes (Signed)
Pt has epidural.  SVE still 7/C/0, AROM clear.    Start pitocin.

## 2015-02-24 NOTE — Transfer of Care (Signed)
Immediate Anesthesia Transfer of Care Note  Patient: Connie [name]  Procedure(s) Performed: Procedure(s) with comments: VAGINAL DELIVERY - double set up twins   Patient Location: L and D  Anesthesia Type:Epidural  Level of Consciousness: awake, alert  and oriented  Airway & Oxygen Therapy: Patient Spontanous Breathing  Post-op Assessment: Report given to RN and Post -op Vital signs reviewed and stable  Post vital signs: Reviewed and stable  Last Vitals:  Filed Vitals:   02/24/15 1902 02/24/15 1948  BP: 131/76 118/62  Pulse: 74 76  Temp:    Resp: 18 16    Complications: No apparent anesthesia complications

## 2015-02-24 NOTE — H&P (Signed)
39 y.o. [redacted]w[redacted]d  G2P1001 comes in c/o contractions.  Pt has twins and was Vtx/Trans yesterday on [place].  Pt was consented several times for breech extraction of second twin if comes down feet first.  Pt was 7 cm in office and was scheduled for induction today.  No LOF.  Otherwise has good fetal movement and no bleeding.  Past Medical History  Diagnosis Date  . HYPERTHYROIDISM 11/22/2006    Qualifier: Diagnosis of  By: Everardo All MD, Cleophas Dunker   . Broken ankle 10/2014    left   . Graves' disease   . PPH (postpartum hemorrhage) no blood    per patient    Past Surgical History  Procedure Laterality Date  . No past surgeries    . Foot surgery      OB History  Gravida Para Term Preterm AB SAB TAB Ectopic Multiple Living  2 1 1       1     # Outcome Date GA Lbr Len/2nd Weight Sex Delivery Anes PTL Lv  2 Current           1 Term 2009 104w0d  3.771 kg (8 lb 5 oz) F Vag-Spont EPI  Y      Social History   Social History  . Marital Status: Married    Spouse Name: N/A  . Number of Children: N/A  . Years of Education: N/A   Occupational History  . Not on file.   Social History Main Topics  . Smoking status: Never Smoker   . Smokeless tobacco: Never Used  . Alcohol Use: No  . Drug Use: No  . Sexual Activity: Not Currently   Other Topics Concern  . Not on file   Social History Narrative   Adhesive    Prenatal Transfer Tool  Maternal Diabetes: No Genetic Screening: Normal- [place] low risk Maternal Ultrasounds/Referrals: Normal- last [place] at 35 weeks EFW 2574/2561; [place] for presentation yesterday VTX/Trv Fetal Ultrasounds or other Referrals:  None, Referred to Materal Fetal Medicine  for AMA Maternal Substance Abuse:  No Significant Maternal Medications:  None Significant Maternal Lab Results: Lab values include: Other: TFTs all normal during pregnancy off meds for Graves  Other PNC: uncomplicated twins AMA.  Advanced cervical dilation.    Filed Vitals:   02/24/15 0600 02/24/15 0700   BP: 128/78 106/60  Pulse: 71 80  Temp:       Lungs/Cor:  NAD Abdomen:  soft, gravid Ex:  no cords, erythema SVE:  Pending epidural- was 7/C/0 in office. FHTs:  A 120s B 130s, good STV, NST R Toco:  q 3-4   A/P   Full term twins Di/Di s/p IUI, both girls.  In labor.  Will augment with pitiocin when pt gets epidural.  GBS neg.  Planned delivery in OR with breech extraction of B if necessary- pt consented.    Merlyn Bollen A

## 2015-02-24 NOTE — Anesthesia Preprocedure Evaluation (Signed)
Anesthesia Evaluation  Patient identified by MRN, date of birth, ID band Patient awake    Reviewed: Allergy & Precautions, NPO status , Patient's Chart, lab work & pertinent test results  History of Anesthesia Complications Negative for: history of anesthetic complications  Airway Mallampati: II  TM Distance: >3 FB Neck ROM: Full    Dental no notable dental hx. (+) Dental Advisory Given   Pulmonary neg pulmonary ROS,    Pulmonary exam normal breath sounds clear to auscultation       Cardiovascular negative cardio ROS Normal cardiovascular exam Rhythm:Regular Rate:Normal     Neuro/Psych negative neurological ROS  negative psych ROS   GI/Hepatic negative GI ROS, Neg liver ROS,   Endo/Other  Hyperthyroidism   Renal/GU negative Renal ROS  negative genitourinary   Musculoskeletal negative musculoskeletal ROS (+)   Abdominal   Peds negative pediatric ROS (+)  Hematology negative hematology ROS (+)   Anesthesia Other Findings   Reproductive/Obstetrics (+) Pregnancy Twin pregnancy, vertex/transverse, plan to deliver in OR                             Anesthesia Physical Anesthesia Plan  ASA: II  Anesthesia Plan: Epidural   Post-op Pain Management:    Induction: Intravenous  Airway Management Planned:   Additional Equipment:   Intra-op Plan:   Post-operative Plan:   Informed Consent: I have reviewed the patients History and Physical, chart, labs and discussed the procedure including the risks, benefits and alternatives for the proposed anesthesia with the patient or authorized representative who has indicated his/her understanding and acceptance.   Dental advisory given  Plan Discussed with: CRNA  Anesthesia Plan Comments:         Anesthesia Quick Evaluation

## 2015-02-24 NOTE — Anesthesia Procedure Notes (Signed)
Epidural Patient location during procedure: OB  Staffing Anesthesiologist: Mohd. Derflinger Performed by: anesthesiologist   Preanesthetic Checklist Completed: patient identified, site marked, surgical consent, pre-op evaluation, timeout performed, IV checked, risks and benefits discussed and monitors and equipment checked  Epidural Patient position: sitting Prep: site prepped and draped and DuraPrep Patient monitoring: continuous pulse ox and blood pressure Approach: midline Location: L3-L4 Injection technique: LOR saline  Needle:  Needle type: Tuohy  Needle gauge: 17 G Needle length: 9 cm and 9 Needle insertion depth: 5 cm cm Catheter type: closed end flexible Catheter size: 19 Gauge Catheter at skin depth: 10 cm Test dose: negative  Assessment Events: blood not aspirated, injection not painful, no injection resistance, negative IV test and no paresthesia  Additional Notes Patient identified. Risks/Benefits/Options discussed with patient including but not limited to bleeding, infection, nerve damage, paralysis, failed block, incomplete pain control, headache, blood pressure changes, nausea, vomiting, reactions to medication both or allergic, itching and postpartum back pain. Confirmed with bedside nurse the patient's most recent platelet count. Confirmed with patient that they are not currently taking any anticoagulation, have any bleeding history or any family history of bleeding disorders. Patient expressed understanding and wished to proceed. All questions were answered. Sterile technique was used throughout the entire procedure. Please see nursing notes for vital signs. Test dose was given through epidural catheter and negative prior to continuing to dose epidural or start infusion. Warning signs of high block given to the patient including shortness of breath, tingling/numbness in hands, complete motor block, or any concerning symptoms with instructions to call for help. Patient was  given instructions on fall risk and not to get out of bed. All questions and concerns addressed with instructions to call with any issues or inadequate analgesia.      

## 2015-02-25 LAB — CBC
HEMATOCRIT: 27.5 % — AB (ref 36.0–46.0)
Hemoglobin: 8.9 g/dL — ABNORMAL LOW (ref 12.0–15.0)
MCH: 26.1 pg (ref 26.0–34.0)
MCHC: 32.4 g/dL (ref 30.0–36.0)
MCV: 80.6 fL (ref 78.0–100.0)
PLATELETS: 180 10*3/uL (ref 150–400)
RBC: 3.41 MIL/uL — ABNORMAL LOW (ref 3.87–5.11)
RDW: 16.3 % — AB (ref 11.5–15.5)
WBC: 8.3 10*3/uL (ref 4.0–10.5)

## 2015-02-25 NOTE — Anesthesia Postprocedure Evaluation (Signed)
Anesthesia Post Note  Patient: Connie [name]  Procedure(s) Performed: Procedure(s): VAGINAL DELIVERY  Patient location during evaluation: Mother Baby Anesthesia Type: Epidural Level of consciousness: awake Pain management: pain level controlled Vital Signs Assessment: post-procedure vital signs reviewed and stable Respiratory status: spontaneous breathing Cardiovascular status: stable Postop Assessment: no headache, no backache, epidural receding, patient able to bend at knees, no signs of nausea or vomiting and adequate PO intake Anesthetic complications: no    Last Vitals:  Filed Vitals:   02/25/15 0130 02/25/15 0545  BP: 108/70 105/63  Pulse: 58 66  Temp: 36.8 C 36.3 C  Resp: 18 17    Last Pain:  Filed Vitals:   02/25/15 0647  PainSc: 4                  Ozan Maclay

## 2015-02-25 NOTE — Progress Notes (Signed)
Patient is eating, ambulating, voiding.  Pain control is good.  Appropriate lochia, no complaints.  Filed Vitals:   02/24/15 2015 02/24/15 2139 02/25/15 0130 02/25/15 0545  BP: 129/76 115/77 108/70 105/63  Pulse: 67 79 58 66  Temp: 98.4 F (36.9 C) 97.7 F (36.5 C) 98.3 F (36.8 C) 97.3 F (36.3 C)  TempSrc: Oral Oral Oral Oral  Resp: Height:      Weight:      SpO2:        Fundus firm No CT  Lab Results  Component Value Date   WBC 8.3 02/25/2015   HGB 8.9* 02/25/2015   HCT 27.5* 02/25/2015   MCV 80.6 02/25/2015   PLT 180 02/25/2015    --/--/O POS (12/21 1610)  A/P Post partum day 1 NSVD twins- breech extraction of second twin Doing well, both babies doing well. Anemia - iron  Routine care.  Expect d/c 12/23.    Connie [name]

## 2015-02-25 NOTE — Lactation Note (Signed)
This note was copied from the chart of Connie [name]. Lactation Consultation Note  Patient Name: Connie [name] [id number] Date: 02/25/2015 Reason for consult: Initial assessment;Late preterm infant;Infant < 6lbs;Multiple gestation   Visited with Mom and FOB, babies 11 hrs old.  Twins born at [redacted]w[redacted]d, weighing A- 6lbs 4.5oz, and B- 5 lbs 10oz.  Mom intended to exclusively pump and bottle feed.  But babies haven't bottle fed very well, so Mom has changed her plan to include some breast feeding.  Assisted with placing babies skin to skin in football hold.  Both babies latched easily.  Showed Mom how to use cloth diaper rolled up under breast to offer support during feedings.  Mom wants to continue to try to offer bottles also, but wanted to try another formula. Alimentum formula brought into room, and volume parameters identified to Mom and FOB.  Encouraged skin to skin, and cue based feedings. Brochure left in room.  Explained to Mom about IP and OP Lactation support available.  Encouraged Mom to call her RN for assistance with breast feeding.  Lactation to follow up in am.   Consult Status Consult Status: Follow-up Date: 02/26/15 Follow-up type: In-patient    Connie [name] 02/25/2015, 4:34 PM

## 2015-02-26 ENCOUNTER — Encounter (HOSPITAL_COMMUNITY): Payer: Self-pay | Admitting: Obstetrics and Gynecology

## 2015-02-26 MED ORDER — OXYCODONE-ACETAMINOPHEN 5-325 MG PO TABS: 1.0000 | ORAL_TABLET | ORAL | Status: DC | PRN

## 2015-02-26 MED ORDER — DOCUSATE SODIUM 100 MG PO CAPS: 100.0000 mg | ORAL_CAPSULE | Freq: Two times a day (BID) | ORAL | Status: DC

## 2015-02-26 MED ORDER — IBUPROFEN 600 MG PO TABS: 600.0000 mg | ORAL_TABLET | Freq: Four times a day (QID) | ORAL | Status: DC | PRN

## 2015-02-26 NOTE — Lactation Note (Signed)
This note was copied from the chart of [name]. Lactation Consultation Note  Patient Name: Connie [name] [id number] Date: 02/26/2015  Pediatrician has been in and discharged infants. Follow up appointment with pediatrician tomorrow. Mother is experienced with breastfeeding. She reports twins are improving with feeding at the breast. Mother is expressing 5-10 ml of clostrum, breast are filling. Mother does not have a breast pump for home. Insurance to provide pump after the new year. Informed about rental program and mother to call before leaving if needed. May purchase a pump. Advised that she will most likely need to continue to pump to establish and maintain milk supply until infants are capable to soften breast. Mother plans to continue supplementing with EBM/ formula until full milk production due to infants being late preterm. Mom made aware of O/P services, breastfeeding support groups, community resources, and our phone # for post-discharge questions.    Maternal Data    Feeding    LATCH Score/Interventions                      Lactation Tools Discussed/Used     Consult Status      Omar Person 02/26/2015, 9:16 AM

## 2015-02-26 NOTE — Lactation Note (Signed)
This note was copied from the chart of GirlB Kriste Sferrazza. Lactation Consultation Note Mom BF twins then supplementing w/formula. Encouraged to massage breast at intervals during BF. Monitor output. Mom stated baby's BF for 20 min. Drink formula, then 1 hr later want to eat again. Discussed cluster feeding. Supply and demand. Patient Name: Connie [name] [id number] Date: 02/26/2015 Reason for consult: Follow-up assessment   Maternal Data    Feeding Feeding Type: Formula Nipple Type: Slow - flow Length of feed: 15 min  LATCH Score/Interventions       Type of Nipple: Everted at rest and after stimulation  Comfort (Breast/Nipple): Filling, red/small blisters or bruises, mild/mod discomfort           Lactation Tools Discussed/Used Tools: Pump;Bottle   Consult Status Consult Status: Follow-up Date: 02/26/15 Follow-up type: In-patient    Charyl Dancer 02/26/2015, 3:40 AM

## 2015-02-26 NOTE — Discharge Summary (Signed)
Obstetric Discharge Summary Reason for Admission: induction of labor Prenatal Procedures: NST, ultrasound and antenatal steroids Intrapartum Procedures: spontaneous vaginal delivery Postpartum Procedures: none Complications-Operative and Postpartum: 2nd degree perineal laceration HEMOGLOBIN  Date Value Ref Range Status  02/25/2015 8.9* 12.0 - 15.0 g/dL Final  16/12/9602 9.6 g/dL Final   HCT  Date Value Ref Range Status  02/25/2015 27.5* 36.0 - 46.0 % Final  08/21/2014 28 % Final    Physical Exam:  General: alert, cooperative and appears stated age 39: appropriate Uterine Fundus: firm Incision: healing well DVT Evaluation: No evidence of DVT seen on physical exam.  Discharge Diagnoses: Term Pregnancy-delivered and twin gestation  Discharge Information: Date: 02/26/2015 Activity: pelvic rest Diet: routine Medications: Ibuprofen, Colace and Percocet Condition: improved Instructions: refer to practice specific booklet Discharge to: home Follow-up Information    Follow up with HORVATH,MICHELLE A, MD In 4 weeks.   Specialty:  Obstetrics and Gynecology   Why:  Follow-up in 4-6 weeks for a postpartum evaluation   Contact information:   [address] [place] [address] [phone]       Newborn Data:   Connie [name], Connie [name] [[id number]]  Live born female  Birth Weight: 6 lb 4.5 oz (2850 g) APGAR: 9, 9   Connie [name], Connie [name] [[id number]]  Live born female  Birth Weight: 5 lb 10.8 oz (2575 g) APGAR: 8, 9  Home with mother.  Connie Wilborn H. 02/26/2015, 8:44 AM

## 2015-02-26 NOTE — Lactation Note (Signed)
This note was copied from the chart of Connie [name]. Lactation Consultation Note Mom planned on pumping and bottle feeding. Babies love the breast for feeding. Using bottle formula as supplement. BF up to 20 min. Each. Reminded not to BF and bottle feed longer than 30 min. Combined. Moms breast are feeling slightly heavy, easily expressed colostrum, encouraged mom to wear her bra for support and massage breast at intervals during BF. Mom had bottles of formula sitting around in room. Reminded they were good for only 1 hr. After opening and feeding. Encouraged to throw others away so not to get confused. Mom c/o nausea. Informed mom sometimes when the milk comes in you can become nauseated. Mom stated she has been nauseated since delivery. Informed nurse.  Patient Name: Connie [name] [id number] Date: 02/26/2015 Reason for consult: Follow-up assessment   Maternal Data    Feeding Feeding Type: Bottle Fed - Formula Nipple Type: Slow - flow Length of feed: 20 min  LATCH Score/Interventions       Type of Nipple: Everted at rest and after stimulation  Comfort (Breast/Nipple): Filling, red/small blisters or bruises, mild/mod discomfort  Problem noted: Filling Interventions (Filling): Double electric pump;Massage;Firm support;Frequent nursing  Hold (Positioning): No assistance needed to correctly position infant at breast. Intervention(s): Position options;Support Pillows;Skin to skin     Lactation Tools Discussed/Used Tools: Pump Breast pump type: Double-Electric Breast Pump   Consult Status Consult Status: Follow-up Date: 02/26/15 Follow-up type: In-patient    Charyl Dancer 02/26/2015, 3:25 AM

## 2015-03-02 ENCOUNTER — Inpatient Hospital Stay (HOSPITAL_COMMUNITY): Admission: RE | Admit: 2015-03-02 | Payer: BC Managed Care – PPO | Source: Ambulatory Visit

## 2015-04-16 ENCOUNTER — Other Ambulatory Visit: Payer: Self-pay | Admitting: Obstetrics and Gynecology

## 2015-04-19 LAB — CYTOLOGY - PAP

## 2015-06-29 ENCOUNTER — Encounter: Payer: Self-pay | Admitting: Endocrinology

## 2015-06-29 ENCOUNTER — Ambulatory Visit (INDEPENDENT_AMBULATORY_CARE_PROVIDER_SITE_OTHER): Payer: BC Managed Care – PPO | Admitting: Endocrinology

## 2015-06-29 VITALS — BP 112/50 | HR 78 | Temp 98.6°F | Ht 68.0 in | Wt 161.0 lb

## 2015-06-29 DIAGNOSIS — E0590 Thyrotoxicosis, unspecified without thyrotoxic crisis or storm: Secondary | ICD-10-CM | POA: Diagnosis not present

## 2015-06-29 LAB — TSH: TSH: 0.15 u[IU]/mL — AB (ref 0.35–4.50)

## 2015-06-29 LAB — T4, FREE: FREE T4: 2.27 ng/dL — AB (ref 0.60–1.60)

## 2015-06-29 MED ORDER — METHIMAZOLE 10 MG PO TABS: 10.0000 mg | ORAL_TABLET | Freq: Two times a day (BID) | ORAL | Status: DC

## 2015-06-29 NOTE — Patient Instructions (Signed)
blood tests are requested for you today.  We'll let you know about the results.  Please come back for a follow-up appointment in 4-6 months.

## 2015-06-29 NOTE — Progress Notes (Signed)
Subjective:    Patient ID: Connie [name], female    DOB: [birth year], 40 y.o.   MRN: [id number]  HPI Pt returns for f/u of hyperthyroidism, due to grave's dz (dx'ed 2008, on a routine blood test; scan was c/w Grave's dz; she was scheduled for i-131 rx in 2008, but pregnancy test on that day was positive; she took tapazole for a few mos, but stopped in late 2015, in preparation for pregnancy).  She is now 4 months postpartum.  Since then, she has intermittent palpitations in the chest.  She gained 60 lbs with the pregnancy, and has since re-lost 45 of that.   Past Medical History  Diagnosis Date  . HYPERTHYROIDISM 11/22/2006    Qualifier: Diagnosis of  By: Everardo All MD, Cleophas Dunker   . Broken ankle 10/2014    left   . Graves' disease   . PPH (postpartum hemorrhage) no blood    per patient    Past Surgical History  Procedure Laterality Date  . No past surgeries    . Foot surgery    . Vaginal delivery  02/24/2015    Procedure: VAGINAL DELIVERY;  Surgeon: Carrington Clamp, MD;  Location: [facility];  Service: Obstetrics;;  double set up twins     Social History   Social History  . Marital Status: Married    Spouse Name: N/A  . Number of Children: N/A  . Years of Education: N/A   Occupational History  . Not on file.   Social History Main Topics  . Smoking status: Never Smoker   . Smokeless tobacco: Never Used  . Alcohol Use: No  . Drug Use: No  . Sexual Activity: Not Currently   Other Topics Concern  . Not on file   Social History Narrative    Current Outpatient Prescriptions on File Prior to Visit  Medication Sig Dispense Refill  . docusate sodium (COLACE) 100 MG capsule Take 1 capsule (100 mg total) by mouth 2 (two) times daily. (Patient not taking: Reported on 06/29/2015) 60 capsule 0  . ibuprofen (ADVIL,MOTRIN) 600 MG tablet Take 1 tablet (600 mg total) by mouth every 6 (six) hours as needed. (Patient not taking: Reported on 06/29/2015) 90 tablet 0  . oxyCODONE-acetaminophen  (ROXICET) 5-325 MG tablet Take 1-2 tablets by mouth every 4 (four) hours as needed for severe pain. (Patient not taking: Reported on 06/29/2015) 30 tablet 0   No current facility-administered medications on file prior to visit.    Allergies  Allergen Reactions  . Adhesive [Tape] Rash    Paper tape is fine    Family History  Problem Relation Age of Onset  . Diabetes Mother   . Heart disease Mother   . COPD Mother   . Asthma Mother   . Diabetes Father   . Sarcoidosis Father   . Diabetes Sister     BP 112/50 mmHg  Pulse 78  Temp(Src) 98.6 F (37 C) (Oral)  Ht 5\' 8"  (1.727 m)  Wt 161 lb (73.029 kg)  BMI 24.49 kg/m2  SpO2 98%  Review of Systems Denies tremor    Objective:   Physical Exam VITAL SIGNS:  See vs page GENERAL: no distress NECK: There is no palpable thyroid enlargement.  No thyroid nodule is palpable.  No palpable lymphadenopathy at the anterior neck.  Lab Results  Component Value Date   TSH 0.15* 06/29/2015      Assessment & Plan:  Hyperthyroidism, recurrent off rx.  Patient is advised the following: Patient  Instructions  blood tests are requested for you today.  We'll let you know about the results.  Please come back for a follow-up appointment in 4-6 months.    addendum: i have sent a prescription to your pharmacy, to resume tapazole

## 2015-08-04 ENCOUNTER — Ambulatory Visit (INDEPENDENT_AMBULATORY_CARE_PROVIDER_SITE_OTHER): Payer: BC Managed Care – PPO | Admitting: Endocrinology

## 2015-08-04 ENCOUNTER — Encounter: Payer: Self-pay | Admitting: Endocrinology

## 2015-08-04 VITALS — BP 112/60 | HR 83 | Temp 97.3°F | Ht 68.0 in | Wt 162.0 lb

## 2015-08-04 DIAGNOSIS — E0590 Thyrotoxicosis, unspecified without thyrotoxic crisis or storm: Secondary | ICD-10-CM

## 2015-08-04 LAB — T4, FREE: FREE T4: 1.34 ng/dL (ref 0.60–1.60)

## 2015-08-04 LAB — TSH: TSH: 0.1 u[IU]/mL — ABNORMAL LOW (ref 0.35–4.50)

## 2015-08-04 MED ORDER — METHIMAZOLE 10 MG PO TABS: 10.0000 mg | ORAL_TABLET | Freq: Every day | ORAL | Status: DC

## 2015-08-04 NOTE — Patient Instructions (Signed)
blood tests are requested for you today.  We'll let you know about the results. if ever you have fever while taking methimazole, stop it and call us, even if the reason is obvious, because of the risk of a rare side-effect.   Please come back for a follow-up appointment in 3 months.  

## 2015-08-04 NOTE — Progress Notes (Signed)
   Subjective:    Patient ID: Connie [name], female    DOB: 03-15-1975, 40 y.o.   MRN: [id number]  HPI Pt returns for f/u of hyperthyroidism, due to grave's dz (dx'ed 2008, on a routine blood test; scan was c/w Grave's dz; she was scheduled for i-131 rx in 2008, but pregnancy test on that day was positive; she took tapazole for a few mos, but stopped in late 2015, in preparation for pregnancy).  She is now 6 months postpartum.  She has been back on tapazole x 2 weeks.  Since then, pt states she does not miss it, and she feels well in general.  She is not breast feeding.  She declines RAI, due to need to care for children.   Past Medical History  Diagnosis Date  . HYPERTHYROIDISM 11/22/2006    Qualifier: Diagnosis of  By: Everardo All MD, Cleophas Dunker   . Broken ankle 10/2014    left   . Graves' disease   . PPH (postpartum hemorrhage) no blood    per patient    Past Surgical History  Procedure Laterality Date  . No past surgeries    . Foot surgery    . Vaginal delivery  02/24/2015    Procedure: VAGINAL DELIVERY;  Surgeon: Carrington Clamp, MD;  Location: [facility];  Service: Obstetrics;;  double set up twins     Social History   Social History  . Marital Status: Married    Spouse Name: N/A  . Number of Children: N/A  . Years of Education: N/A   Occupational History  . Not on file.   Social History Main Topics  . Smoking status: Never Smoker   . Smokeless tobacco: Never Used  . Alcohol Use: No  . Drug Use: No  . Sexual Activity: Not Currently   Other Topics Concern  . Not on file   Social History Narrative    No current outpatient prescriptions on file prior to visit.   No current facility-administered medications on file prior to visit.    Allergies  Allergen Reactions  . Adhesive [Tape] Rash    Paper tape is fine    Family History  Problem Relation Age of Onset  . Diabetes Mother   . Heart disease Mother   . COPD Mother   . Asthma Mother   . Diabetes Father   .  Sarcoidosis Father   . Diabetes Sister     BP 112/60 mmHg  Pulse 83  Temp(Src) 97.3 F (36.3 C) (Oral)  Ht  (1.727 m)  Wt 162 lb (73.483 kg)  BMI 24.64 kg/m2  SpO2 95%  Review of Systems Denies fever    Objective:   Physical Exam VITAL SIGNS:  See vs page GENERAL: no distress Skin: not diaphoretic Neuro: no tremor   Lab Results  Component Value Date   TSH 0.10* 08/04/2015      Assessment & Plan:  Hyperthyroidism: improved  Patient is advised the following: Patient Instructions  blood tests are requested for you today.  We'll let you know about the results.  if ever you have fever while taking methimazole, stop it and call us, even if the reason is obvious, because of the risk of a rare side-effect.  Please come back for a follow-up appointment in 3 months.    addendum: reduce methimazole to 10 mg per day. Romero Belling, MD

## 2015-11-03 ENCOUNTER — Ambulatory Visit (INDEPENDENT_AMBULATORY_CARE_PROVIDER_SITE_OTHER): Payer: BC Managed Care – PPO | Admitting: Endocrinology

## 2015-11-03 VITALS — BP 122/62 | HR 85 | Ht 68.0 in | Wt 172.0 lb

## 2015-11-03 DIAGNOSIS — E0590 Thyrotoxicosis, unspecified without thyrotoxic crisis or storm: Secondary | ICD-10-CM

## 2015-11-03 LAB — TSH: TSH: 0.11 u[IU]/mL — AB (ref 0.35–4.50)

## 2015-11-03 LAB — T4, FREE: Free T4: 1.96 ng/dL — ABNORMAL HIGH (ref 0.60–1.60)

## 2015-11-03 MED ORDER — METHIMAZOLE 10 MG PO TABS: 10.0000 mg | ORAL_TABLET | Freq: Two times a day (BID) | ORAL | 4 refills | Status: DC

## 2015-11-03 NOTE — Progress Notes (Signed)
   Subjective:    Patient ID: Connie [name], female    DOB: 05-26-1975, 40 y.o.   MRN: [id number]  HPI Pt returns for f/u of hyperthyroidism, due to Grave's Dz (dx'ed 2008, on a routine blood test; scan was c/w Grave's Dz; she was scheduled for RAI rx in 2008, but pregnancy test on that day was positive; she took tapazole for a few mos, but stopped in late 2015, in preparation for pregnancy; she is now 8 months postpartum, and is back on tapazole; she is not breast feeding; she declines RAI, due to need to care for children).  She takes tapazole as rx'ed.  pt states she feels well in general.  She gained 50 lbs with the pregnancy, and has re-lost 21 lbs of that.   Past Medical History:  Diagnosis Date  . Broken ankle 10/2014   left   . Graves' disease   . HYPERTHYROIDISM 11/22/2006   Qualifier: Diagnosis of  By: Everardo All MD, Cleophas Dunker PPH (postpartum hemorrhage) no blood   per patient    Past Surgical History:  Procedure Laterality Date  . FOOT SURGERY    . NO PAST SURGERIES    . VAGINAL DELIVERY  02/24/2015   Procedure: VAGINAL DELIVERY;  Surgeon: Carrington Clamp, MD;  Location: [facility];  Service: Obstetrics;;  double set up twins     Social History   Social History  . Marital status: Married    Spouse name: N/A  . Number of children: N/A  . Years of education: N/A   Occupational History  . Not on file.   Social History Main Topics  . Smoking status: Never Smoker  . Smokeless tobacco: Never Used  . Alcohol use No  . Drug use: No  . Sexual activity: Not Currently   Other Topics Concern  . Not on file   Social History Narrative  . No narrative on file    No current outpatient prescriptions on file prior to visit.   No current facility-administered medications on file prior to visit.     Allergies  Allergen Reactions  . Adhesive [Tape] Rash    Paper tape is fine    Family History  Problem Relation Age of Onset  . Diabetes Mother   . Heart disease Mother     . COPD Mother   . Asthma Mother   . Diabetes Father   . Sarcoidosis Father   . Diabetes Sister     BP 122/62   Pulse 85   Ht 5\' 8"  (1.727 m)   Wt 172 lb (78 kg)   SpO2 97%   BMI 26.15 kg/m   Review of Systems Denies fever.      Objective:   Physical Exam VITAL SIGNS:  See vs page GENERAL: no distress NECK: There is no palpable thyroid enlargement.  No thyroid nodule is palpable.  No palpable lymphadenopathy at the anterior neck.     Lab Results  Component Value Date   TSH 0.11 (L) 11/03/2015      Assessment & Plan:  Hyperthyroidism, not improved: increase the methimazole back to twice a day

## 2015-11-03 NOTE — Patient Instructions (Addendum)
Thyroid blood tests are requested for you today.  We'll let you know about the results.  if ever you have fever while taking methimazole, stop it and call us, even if the reason is obvious, because of the risk of a rare side-effect.   Please come back for a follow-up appointment in 3 months.    

## 2015-11-15 ENCOUNTER — Ambulatory Visit (INDEPENDENT_AMBULATORY_CARE_PROVIDER_SITE_OTHER): Payer: BC Managed Care – PPO | Admitting: Neurology

## 2015-11-15 ENCOUNTER — Encounter: Payer: Self-pay | Admitting: Neurology

## 2015-11-15 ENCOUNTER — Encounter: Payer: Self-pay | Admitting: *Deleted

## 2015-11-15 VITALS — BP 116/67 | HR 89 | Ht 66.0 in | Wt 172.4 lb

## 2015-11-15 DIAGNOSIS — H9201 Otalgia, right ear: Secondary | ICD-10-CM

## 2015-11-15 DIAGNOSIS — R51 Headache: Secondary | ICD-10-CM | POA: Diagnosis not present

## 2015-11-15 DIAGNOSIS — H539 Unspecified visual disturbance: Secondary | ICD-10-CM

## 2015-11-15 DIAGNOSIS — R519 Headache, unspecified: Secondary | ICD-10-CM

## 2015-11-15 DIAGNOSIS — H93A1 Pulsatile tinnitus, right ear: Secondary | ICD-10-CM

## 2015-11-15 MED ORDER — METHYLPREDNISOLONE 4 MG PO TBPK: ORAL_TABLET | ORAL | 1 refills | Status: DC

## 2015-11-15 NOTE — Progress Notes (Signed)
[facility]    Provider:  Dr Lucia Gaskins Referring Provider: Frederica Kuster, MD Primary Care Physician:  Frederica Kuster, MD  CC:  New onset persistent headache  HPI:  Connie [name] is a 40 y.o. female here as a referral from Dr. Jonette Eva for migraines. Past medical history t Graves' disease and thyrotoxicosis. She has not really had headaches int he past, may had some in college. Father and aunt had a brain aneurysm both ruptured her aunt died from it fatjer was hospitalized and air lifted when is cerebral aneurysm ruptured. She had headaches in college but that is the last time she had headache. This started about 1.5 months ago. At least 10-15 headaches since then severe and persistent. She was diagnosed with a severe sinus infection recently, she has had severe congestion for 2 weeks the headache started previous possibly to the infection but unclear when her infection started. She is still congested and drainage no sore throat no hearing changes but her ear does hurt. Father and his sister have had aneurysms. Both ruptured. No Fhx of migraines. It started on the sides pounding, now more in the front, more on the right above the eyes, pounding and throbbing, right ear hurting. She cuts of the light which makes it better, +photophobia, (she has an 39 year old and 8 month twins), sound doesn't bother her, no nausea, no vomiting, feels like both eyes are swollen with dark under the eyes, she is having blurry vision as well with the headches. The headaches can be 10/10 in pain she had to pull over the car due to the pain it was so bad. Headaches last an hour and recur multiple times a day. Advil and excedrin have helped, no medication overuse. Having the headache every other day at least. No new medications, no previous illnesses, she was at home when it started initially acutely, not associated with time of day or position. She hears a heartbeat in her ear, she describes Pulsatile tinnitus as well. She  has taken 2 rounds of a z-pack without relief still with congestion, ear pain and cough.  Reviewed notes, labs and imaging from outside physicians, which showed:   TSH is 0.110 11/03/2015  Review of Systems: Patient complains of symptoms per HPI as well as the following symptoms: Weight gain, fatigue, blurred vision, eye pain, cough, ringing in ears, allergies, runny nose. Pertinent negatives per HPI. All others negative.   Social History   Social History  . Marital status: Married    Spouse name: Jasmine December  . Number of children: 3  . Years of education: PhD   Occupational History  . University     Social History Main Topics  . Smoking status: Never Smoker  . Smokeless tobacco: Never Used  . Alcohol use No  . Drug use: No  . Sexual activity: Not Currently   Other Topics Concern  . Not on file   Social History Narrative   Lives with daughter   Caffeine use: Soda once per week    Family History  Problem Relation Age of Onset  . Diabetes Mother   . Heart disease Mother   . COPD Mother   . Asthma Mother   . Diabetes Father   . Sarcoidosis Father   . Diabetes Sister     Past Medical History:  Diagnosis Date  . Broken ankle 10/2014   left   . Graves' disease   . HYPERTHYROIDISM 11/22/2006   Qualifier: Diagnosis of  By: Everardo All MD, Gregary Signs  A   . PPH (postpartum hemorrhage) no blood   per patient    Past Surgical History:  Procedure Laterality Date  . FOOT SURGERY    . NO PAST SURGERIES    . VAGINAL DELIVERY  02/24/2015   Procedure: VAGINAL DELIVERY;  Surgeon: Carrington Clamp, MD;  Location: [facility];  Service: Obstetrics;;  double set up twins     Current Outpatient Prescriptions  Medication Sig Dispense Refill  . methimazole (TAPAZOLE) 10 MG tablet Take 1 tablet (10 mg total) by mouth 2 (two) times daily. 60 tablet 4  . methylPREDNISolone (MEDROL DOSEPAK) 4 MG TBPK tablet follow package directions 21 tablet 1   No current facility-administered medications for  this visit.     Allergies as of 11/15/2015 - Review Complete 11/03/2015  Allergen Reaction Noted  . Adhesive [tape] Rash 01/13/2015    Vitals: BP 116/67 (BP Location: Right Arm, Patient Position: Sitting, Cuff Size: Normal)   Pulse 89   Ht 5\' 6"  (1.676 m)   Wt 172 lb 6.4 oz (78.2 kg)   BMI 27.83 kg/m  Last Weight:  Wt Readings from Last 1 Encounters:  11/15/15 172 lb 6.4 oz (78.2 kg)   Last Height:   Ht Readings from Last 1 Encounters:  11/15/15 5\' 6"  (1.676 m)   Physical exam: Exam: Gen: NAD, conversant, well nourised, obese, well groomed                     CV: RRR, no MRG. No Carotid Bruits. No peripheral edema, warm, nontender Eyes: Conjunctivae clear without exudates or hemorrhage  Neuro: Detailed Neurologic Exam  Speech:    Speech is normal; fluent and spontaneous with normal comprehension.  Cognition:    The patient is oriented to person, place, and time;     recent and remote memory intact;     language fluent;     normal attention, concentration,     fund of knowledge Cranial Nerves:    The pupils are equal, round, and reactive to light. The fundi are normal and spontaneous venous pulsations are present. Visual fields are full to finger confrontation. Extraocular movements are intact. Left ptosis. Trigeminal sensation is intact and the muscles of mastication are normal. The face is symmetric. The palate elevates in the midline. Hearing intact. Voice is normal. Shoulder shrug is normal. The tongue has normal motion without fasciculations.   Coordination:    Normal finger to nose and heel to shin. Normal rapid alternating movements.   Gait:    Heel-toe and tandem gait are normal.   Motor Observation:    No asymmetry, no atrophy, and no involuntary movements noted. Tone:    Normal muscle tone.    Posture:    Posture is normal. normal erect    Strength:    Strength is V/V in the upper and lower limbs.      Sensation: intact to LT     Reflex  Exam:  DTR's:    Deep tendon reflexes in the upper and lower extremities are normal bilaterally.   Toes:    The toes are downgoing bilaterally.   Clonus:    Clonus is absent.   Assessment/Plan:  40 year old with new onset right-sided headache, persistent sinus congestion and ear pain. FHx of aneurysms, has vision changes, severe recurrent headache, pulsatile tinnitus. Neuro exam is normal.   Headache: MRI of the brain w/wo contrast,  Pulsatile Tinnitus and FHx Aneurysms: MRA of the head Sinusitis:  ENT evaluation  with Dr. Haroldine Lawsrossley for possible sinusitis causing headache Headache: 6 days of steroids, will ensure Dr. Haroldine Lawscrossley is not concerned for her taking steroids (possible infection). Considering headaches just started 90 days ago at this point will try to break the cycle, rule out intracranial etiologies with MRI of the brain and MRA of the head, and if headaches persist can try preventative medication in the future such as topiramate or nortriptyline if no contraindications.  Discussed: To prevent or relieve headaches, try the following: Cool Compress. Lie down and place a cool compress on your head.  Avoid headache triggers. If certain foods or odors seem to have triggered your migraines in the past, avoid them. A headache diary might help you identify triggers.  Include physical activity in your daily routine. Try a daily walk or other moderate aerobic exercise.  Manage stress. Find healthy ways to cope with the stressors, such as delegating tasks on your to-do list.  Practice relaxation techniques. Try deep breathing, yoga, massage and visualization.  Eat regularly. Eating regularly scheduled meals and maintaining a healthy diet might help prevent headaches. Also, drink plenty of fluids.  Follow a regular sleep schedule. Sleep deprivation might contribute to headaches Consider biofeedback. With this mind-body technique, you learn to control certain bodily functions - such as muscle  tension, heart rate and blood pressure - to prevent headaches or reduce headache pain.    Proceed to emergency room if you experience new or worsening symptoms or symptoms do not resolve, if you have new neurologic symptoms or if headache is severe, or for any concerning symptom.  CC: Dr. Richardean ChimeraLazo  Candido Flott, MD  [facility] [address] [place], [place] [id number]  Phone [phone] Fax [phone]

## 2015-11-15 NOTE — Patient Instructions (Addendum)
Remember to drink plenty of fluid, eat healthy meals and do not skip any meals. Try to eat protein with a every meal and eat a healthy snack such as fruit or nuts in between meals. Try to keep a regular sleep-wake schedule and try to exercise daily, particularly in the form of walking, 20-30 minutes a day, if you can.   As far as your medications are concerned, I would like to suggest: medrol dosepak ENT evaluation  As far as diagnostic testing: MRI brain and MRA head  Our phone number is [phone]. We also have an after hours call service for urgent matters and there is a physician on-call for urgent questions. For any emergencies you know to call 911 or go to the nearest emergency room

## 2015-11-16 DIAGNOSIS — R519 Headache, unspecified: Secondary | ICD-10-CM | POA: Insufficient documentation

## 2015-11-16 DIAGNOSIS — R51 Headache: Secondary | ICD-10-CM

## 2015-11-17 ENCOUNTER — Ambulatory Visit (INDEPENDENT_AMBULATORY_CARE_PROVIDER_SITE_OTHER): Payer: BC Managed Care – PPO

## 2015-11-17 DIAGNOSIS — H93A1 Pulsatile tinnitus, right ear: Secondary | ICD-10-CM

## 2015-11-17 DIAGNOSIS — H539 Unspecified visual disturbance: Secondary | ICD-10-CM

## 2015-11-17 DIAGNOSIS — R519 Headache, unspecified: Secondary | ICD-10-CM

## 2015-11-17 DIAGNOSIS — H9201 Otalgia, right ear: Secondary | ICD-10-CM

## 2015-11-17 DIAGNOSIS — R51 Headache: Secondary | ICD-10-CM

## 2015-11-22 ENCOUNTER — Telehealth: Payer: Self-pay | Admitting: Neurology

## 2015-11-22 ENCOUNTER — Ambulatory Visit: Payer: Self-pay | Admitting: Neurology

## 2015-11-22 NOTE — Telephone Encounter (Signed)
Called patient. MRA of the head normal. 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    IMPRESSION:  Abnormal MRI brain (with and without) demonstrating: 1. Several round and ovoid and confluent periventricular and subcortical foci of T2 hyperintensities. No abnormal lesions are seen on post contrast views. These findings are non-specific and considerations include autoimmune, inflammatory, post-infectious, or microvascular ischemic etiologies.  2. No acute findings.

## 2015-11-23 NOTE — Telephone Encounter (Signed)
Left another message for patient today asked her to call back.

## 2015-11-29 ENCOUNTER — Encounter: Payer: Self-pay | Admitting: *Deleted

## 2015-11-29 NOTE — Telephone Encounter (Signed)
Dr Ahern- FYI only 

## 2015-11-29 NOTE — Telephone Encounter (Signed)
Dr Ahern- FYI 

## 2015-11-29 NOTE — Telephone Encounter (Signed)
Pt called in for results. Per skype message with the nurse. Pt has possible appt dates to come in to discuss results on Thursday and also next Tuesday. Pt will have to call back with an answer due to child care.

## 2015-11-29 NOTE — Telephone Encounter (Signed)
FYI-Patient is calling back stating she can come in for an appointment this Thursday at 8am. Please call the patient if there are any questions.

## 2015-11-29 NOTE — Telephone Encounter (Signed)
Kara Mead, please write a letter stating that her MRi was abnormal and we need her to follow up in the office. Attach the copy of the MRi brain. Then ask Angie to mail it with a return receipt and signiature needed. I have been calling with no repsonse thanks

## 2015-11-29 NOTE — Telephone Encounter (Signed)
Spoke to [employer]. She stated Stanton Kidney S in medical records can send request certified. I gave information to Hoy Morn. And she is going to mail certified so we know patient received letter/results per Dr Lucia Gaskins request.

## 2015-11-29 NOTE — Telephone Encounter (Signed)
Scheduled pt on 8/28 at 8am.

## 2015-12-02 ENCOUNTER — Telehealth: Payer: Self-pay | Admitting: Neurology

## 2015-12-02 ENCOUNTER — Ambulatory Visit (INDEPENDENT_AMBULATORY_CARE_PROVIDER_SITE_OTHER): Payer: BC Managed Care – PPO | Admitting: Neurology

## 2015-12-02 DIAGNOSIS — R9082 White matter disease, unspecified: Secondary | ICD-10-CM

## 2015-12-02 DIAGNOSIS — R930 Abnormal findings on diagnostic imaging of skull and head, not elsewhere classified: Secondary | ICD-10-CM | POA: Diagnosis not present

## 2015-12-02 NOTE — Progress Notes (Signed)
[id number]    Provider:  Dr Lucia Gaskins Referring Provider: Frederica Kuster, MD Primary Care Physician:  Frederica Kuster, MD  CC:  New onset persistent headache  Interval History 12/02/2015: grandmother had MS. Headaches have improved since being treated for sinusitis. Had a long discussion with patient and her family about the MRI of her brain, reviewed images. The lesions are suspicious for demyelinating disease such as MS but patient appears to be asymptomatic. 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 Radiological progression usually occurs in approximately 66% of patients with RIS, while ~ 33% per 5 year period will subsequently develop neurological symptoms. While conversion rate roughly equates 33% per 5 year period, a significant number of patients will not subsequently develop MS. Due to absence of clear risk factors defining clinical conversion and lack of current evidence usually no treatment is initiated.   IMPRESSION:  Abnormal MRI brain (with and without) demonstrating: 1. Several round and ovoid and confluent periventricular and subcortical foci of T2 hyperintensities. No abnormal lesions are seen on post contrast views. These findings are non-specific and considerations include autoimmune, inflammatory, post-infectious, or microvascular ischemic etiologies.  2. No acute findings.   HPI:  KIONI STAHL is a 40 y.o. female here as a referral from Dr. Jonette Eva for migraines. Past medical history t Graves' disease and thyrotoxicosis. She has not really had headaches int he past, may had some in college. Father and aunt had a brain aneurysm both ruptured her aunt died from it fatjer was hospitalized and air lifted when is cerebral aneurysm ruptured. She had headaches in college but that is the last time she had headache. This started about 1.5 months ago. At least 10-15  headaches since then severe and persistent. She was diagnosed with a severe sinus infection recently, she has had severe congestion for 2 weeks the headache started previous possibly to the infection but unclear when her infection started. She is still congested and drainage no sore throat no hearing changes but her ear does hurt. Father and his sister have had aneurysms. Both ruptured. No Fhx of migraines. It started on the sides pounding, now more in the front, more on the right above the eyes, pounding and throbbing, right ear hurting. She cuts of the light which makes it better, +photophobia, (she has an 39 year old and 8 month twins), sound doesn't bother her, no nausea, no vomiting, feels like both eyes are swollen with dark under the eyes, she is having blurry vision as well with the headches. The headaches can be 10/10 in pain she had to pull over the car due to the pain it was so bad. Headaches last an hour and recur multiple times a day. Advil and excedrin have helped, no medication overuse. Having the headache every other day at least. No new medications, no previous illnesses, she was at home when it started initially acutely, not associated with time of day or position. She hears a heartbeat in her ear, she describes Pulsatile tinnitus as well. She has taken 2 rounds of a z-pack without relief still with congestion, ear pain and cough.  Reviewed notes, labs and imaging from outside physicians, which showed:   TSH is 0.110 11/03/2015  Review of Systems: Patient complains of symptoms per HPI as well as the following symptoms: Weight gain, fatigue, blurred vision, eye pain, cough, ringing in ears, allergies, runny nose. Pertinent negatives per HPI. All others negative.  Social  History   Social History  . Marital status: Married    Spouse name: Jasmine DecemberSharon  . Number of children: 3  . Years of education: PhD   Occupational History  . University     Social History Main Topics  . Smoking  status: Never Smoker  . Smokeless tobacco: Never Used  . Alcohol use No  . Drug use: No  . Sexual activity: Not Currently   Other Topics Concern  . Not on file   Social History Narrative   Lives with daughter   Caffeine use: Soda once per week    Family History  Problem Relation Age of Onset  . Diabetes Mother   . Heart disease Mother   . COPD Mother   . Asthma Mother   . Diabetes Father   . Sarcoidosis Father   . Diabetes Sister     Past Medical History:  Diagnosis Date  . Broken ankle 10/2014   left   . Graves' disease   . HYPERTHYROIDISM 11/22/2006   Qualifier: Diagnosis of  By: Everardo AllEllison MD, Cleophas DunkerSean A   . PPH (postpartum hemorrhage) no blood   per patient    Past Surgical History:  Procedure Laterality Date  . FOOT SURGERY    . NO PAST SURGERIES    . VAGINAL DELIVERY  02/24/2015   Procedure: VAGINAL DELIVERY;  Surgeon: Carrington ClampMichelle Horvath, MD;  Location: [facility];  Service: Obstetrics;;  double set up twins     Current Outpatient Prescriptions  Medication Sig Dispense Refill  . methimazole (TAPAZOLE) 10 MG tablet Take 1 tablet (10 mg total) by mouth 2 (two) times daily. 60 tablet 4   No current facility-administered medications for this visit.     Allergies as of 12/02/2015 - Review Complete 12/02/2015  Allergen Reaction Noted  . Adhesive [tape] Rash 01/13/2015    Vitals: BP (!) 110/57 (BP Location: Right Arm, Patient Position: Sitting, Cuff Size: Normal)   Pulse 76   Ht 5\' 6"  (1.676 m)   Wt 171 lb 3.2 oz (77.7 kg)   BMI 27.63 kg/m  Last Weight:  Wt Readings from Last 1 Encounters:  12/02/15 171 lb 3.2 oz (77.7 kg)   Last Height:   Ht Readings from Last 1 Encounters:  12/02/15 5\' 6"  (1.676 m)    Physical exam: Exam: Gen: NAD, conversant, well nourised, well groomed                     CV: RRR, no MRG. No Carotid Bruits. No peripheral edema, warm, nontender Eyes: Conjunctivae clear without exudates or hemorrhage  Neuro: Detailed Neurologic  Exam  Speech:    Speech is normal; fluent and spontaneous with normal comprehension.  Cognition:    The patient is oriented to person, place, and time;     recent and remote memory intact;     language fluent;     normal attention, concentration,     fund of knowledge Cranial Nerves:    The pupils are equal, round, and reactive to light. The fundi are normal and spontaneous venous pulsations are present. Visual fields are full to finger confrontation. Extraocular movements are intact. Trigeminal sensation is intact and the muscles of mastication are normal. The face is symmetric. The palate elevates in the midline. Hearing intact. Voice is normal. Shoulder shrug is normal. The tongue has normal motion without fasciculations.   Coordination:    Normal finger to nose and heel to shin. Normal rapid alternating movements.   Gait:  Heel-toe and tandem gait are normal.   Motor Observation    No asymmetry, no atrophy, and no involuntary movements noted. Tone:    Normal muscle tone.    Posture:    Posture is normal. normal erect    Strength:    Strength is V/V in the upper and lower limbs.      Sensation: intact to LT     Reflex Exam:  DTR's:    Deep tendon reflexes in the upper and lower extremities are normal bilaterally.   Toes:    The toes are downgoing bilaterally.   Clonus:    Clonus is absent.      Assessment/Plan:  Had a long discussion with patient and her family about the MRI of her brain, reviewed images. The lesions are suspicious for demyelinating disease such as MS but patient appears to be asymptomatic. 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 Radiological progression usually occurs in approximately 66% of patients with RIS, while ~ 33% per 5 year period will subsequently develop neurological symptoms. While conversion rate roughly equates 33% per 5 year  period, a significant number of patients will not subsequently develop MS. Due to absence of clear risk factors defining clinical conversion and lack of current evidence usually no treatment is initiated.  Will rescan every 6 months  CC: Frederica Kuster, MD  Naomie Dean, MD  [facility] [address] [place], [place] [address]  Phone [phone] Fax [phone]  A total of 30 minutes was spent face-to-face with this patient. Over half this time was spent on counseling patient on the RIS diagnosis and different diagnostic and therapeutic options available.

## 2015-12-02 NOTE — Patient Instructions (Signed)
Remember to drink plenty of fluid, eat healthy meals and do not skip any meals. Try to eat protein with a every meal and eat a healthy snack such as fruit or nuts in between meals. Try to keep a regular sleep-wake schedule and try to exercise daily, particularly in the form of walking, 20-30 minutes a day, if you can.   As far as your medications are concerned, I would like to suggest: None at this time  As far as diagnostic testing: MRI of the brain in 6 months  I would like to see you back in 6 months, sooner if we need to. Please call us with any interim questions, concerns, problems, updates or refill requests.   Our phone number is [phone]. We also have an after hours call service for urgent matters and there is a physician on-call for urgent questions. For any emergencies you know to call 911 or go to the nearest emergency room

## 2015-12-02 NOTE — Telephone Encounter (Signed)
error 

## 2015-12-03 ENCOUNTER — Encounter: Payer: Self-pay | Admitting: Neurology

## 2015-12-03 DIAGNOSIS — R9082 White matter disease, unspecified: Secondary | ICD-10-CM | POA: Insufficient documentation

## 2015-12-21 ENCOUNTER — Ambulatory Visit: Payer: BC Managed Care – PPO | Admitting: Neurology

## 2015-12-29 ENCOUNTER — Ambulatory Visit: Payer: BC Managed Care – PPO | Admitting: Endocrinology

## 2016-03-08 ENCOUNTER — Ambulatory Visit: Payer: BC Managed Care – PPO | Admitting: Endocrinology

## 2016-03-08 ENCOUNTER — Ambulatory Visit (INDEPENDENT_AMBULATORY_CARE_PROVIDER_SITE_OTHER): Payer: BC Managed Care – PPO | Admitting: Endocrinology

## 2016-03-08 ENCOUNTER — Encounter: Payer: Self-pay | Admitting: Endocrinology

## 2016-03-08 VITALS — BP 106/60 | HR 57 | Ht 66.0 in | Wt 170.0 lb

## 2016-03-08 DIAGNOSIS — E0590 Thyrotoxicosis, unspecified without thyrotoxic crisis or storm: Secondary | ICD-10-CM

## 2016-03-08 LAB — T4, FREE: FREE T4: 0.64 ng/dL (ref 0.60–1.60)

## 2016-03-08 LAB — TSH: TSH: 0.13 u[IU]/mL — ABNORMAL LOW (ref 0.35–4.50)

## 2016-03-08 NOTE — Patient Instructions (Signed)
Thyroid blood tests are requested for you today.  We'll let you know about the results.  if ever you have fever while taking methimazole, stop it and call us, even if the reason is obvious, because of the risk of a rare side-effect.   Please come back for a follow-up appointment in 3 months.    

## 2016-03-08 NOTE — Progress Notes (Signed)
Subjective:    Patient ID: Connie [name], female    DOB: 1975-04-19, 41 y.o.   MRN: [id number]  HPI Pt returns for f/u of hyperthyroidism, due to Grave's Dz (dx'ed 2008, on a routine blood test; scan was c/w Grave's Dz; she was scheduled for RAI rx in 2008, but pregnancy test on that day was positive; she took tapazole for a few mos, but stopped in late 2015, in preparation for pregnancy; she is now 1 year postpartum, and is back on tapazole; she is not breast feeding; she declines RAI, due to need to care for children).  She takes tapazole as rx'ed.  pt states she feels well in general.  She gained 50 lbs with the pregnancy, and has re-lost 23 lbs of that.  Past Medical History:  Diagnosis Date  . Broken ankle 10/2014   left   . Graves' disease   . HYPERTHYROIDISM 11/22/2006   Qualifier: Diagnosis of  By: Everardo All MD, Cleophas Dunker PPH (postpartum hemorrhage) no blood   per patient    Past Surgical History:  Procedure Laterality Date  . FOOT SURGERY    . NO PAST SURGERIES    . VAGINAL DELIVERY  02/24/2015   Procedure: VAGINAL DELIVERY;  Surgeon: Carrington Clamp, MD;  Location: [facility];  Service: Obstetrics;;  double set up twins     Social History   Social History  . Marital status: Married    Spouse name: Jasmine December  . Number of children: 3  . Years of education: PhD   Occupational History  . University     Social History Main Topics  . Smoking status: Never Smoker  . Smokeless tobacco: Never Used  . Alcohol use No  . Drug use: No  . Sexual activity: Not Currently   Other Topics Concern  . Not on file   Social History Narrative   Lives with daughter   Caffeine use: Soda once per week    Current Outpatient Prescriptions on File Prior to Visit  Medication Sig Dispense Refill  . methimazole (TAPAZOLE) 10 MG tablet Take 1 tablet (10 mg total) by mouth 2 (two) times daily. 60 tablet 4   No current facility-administered medications on file prior to visit.     Allergies   Allergen Reactions  . Adhesive [Tape] Rash    Paper tape is fine    Family History  Problem Relation Age of Onset  . Diabetes Mother   . Heart disease Mother   . COPD Mother   . Asthma Mother   . Diabetes Father   . Sarcoidosis Father   . Diabetes Sister     BP 106/60   Pulse (!) 57   Ht 5\' 6"  (1.676 m)   Wt 170 lb (77.1 kg)   SpO2 97%   BMI 27.44 kg/m    Review of Systems She denies fever.      Objective:   Physical Exam VITAL SIGNS:  See vs page GENERAL: no distress NECK: There is no palpable thyroid enlargement.  No thyroid nodule is palpable.  No palpable lymphadenopathy at the anterior neck.        Assessment & Plan:  Hyperthyroidism, due for recheck.  Patient is advised the following: Patient Instructions  Thyroid blood tests are requested for you today.  We'll let you know about the results.  if ever you have fever while taking methimazole, stop it and call us, even if the reason is obvious, because of the risk of  a rare side-effect.  Please come back for a follow-up appointment in 3 months.

## 2016-05-31 ENCOUNTER — Encounter: Payer: Self-pay | Admitting: Neurology

## 2016-05-31 ENCOUNTER — Ambulatory Visit (INDEPENDENT_AMBULATORY_CARE_PROVIDER_SITE_OTHER): Payer: BC Managed Care – PPO | Admitting: Neurology

## 2016-05-31 ENCOUNTER — Encounter (INDEPENDENT_AMBULATORY_CARE_PROVIDER_SITE_OTHER): Payer: Self-pay

## 2016-05-31 VITALS — BP 103/67 | HR 65 | Ht 66.0 in | Wt 160.4 lb

## 2016-05-31 DIAGNOSIS — H539 Unspecified visual disturbance: Secondary | ICD-10-CM

## 2016-05-31 DIAGNOSIS — G35 Multiple sclerosis: Secondary | ICD-10-CM | POA: Diagnosis not present

## 2016-05-31 DIAGNOSIS — R930 Abnormal findings on diagnostic imaging of skull and head, not elsewhere classified: Secondary | ICD-10-CM

## 2016-05-31 DIAGNOSIS — R9082 White matter disease, unspecified: Secondary | ICD-10-CM

## 2016-05-31 MED ORDER — ALPRAZOLAM 0.5 MG PO TABS: ORAL_TABLET | ORAL | 0 refills | Status: DC

## 2016-05-31 NOTE — Patient Instructions (Addendum)
Overall you are doing well but I do want to suggest a few things today:   Remember to drink plenty of fluid, eat healthy meals and do not skip any meals. Try to eat protein with a every meal and eat a healthy snack such as fruit or nuts in between meals. Try to keep a regular sleep-wake schedule and try to exercise daily, particularly in the form of walking, 20-30 minutes a day, if you can.   As far as diagnostic testing: Repeat MRI brain and cervical spine  I would like to see you back in 6 months, sooner if we need to. Please call us with any interim questions, concerns, problems, updates or refill requests.   Our phone number is [phone]. We also have an after hours call service for urgent matters and there is a physician on-call for urgent questions. For any emergencies you know to call 911 or go to the nearest emergency room

## 2016-05-31 NOTE — Progress Notes (Signed)
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 Radiologically isolated syndrome   Her headaches have largely resolved but she still has them. No double vision, no sensory changes, no weakness. But she does report changes in vision, she has significant vision impairments she has had glasses for a long time but recently vision changed more.   Interval History 12/02/2015: grandmother had MS. Headaches have improved since being treated for sinusitis. Had a long discussion with patient and her family about the MRI of her brain, reviewed images. The lesions are suspicious for demyelinating disease such as MS but patient appears to be asymptomatic. 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 Radiological progression usually occurs in approximately 66% of patients with RIS, while ~ 33% per 5 year period will subsequently develop neurological symptoms. While conversion rate roughly equates 33% per 5 year period, a significant number of patients will not subsequently develop MS. Due to absence of clear risk factors defining clinical conversion and lack of current evidence usually no treatment is initiated.   IMPRESSION:  Abnormal MRI brain (with and without) demonstrating: 1. Several round and ovoid and confluent periventricular and subcortical foci of T2 hyperintensities. No abnormal lesions are seen on post contrast views. These findings are non-specific and considerations include autoimmune, inflammatory, post-infectious, or microvascular ischemic etiologies.  2. No acute findings.   ZOX:WRUEAV M Womackis a 41 y.o.femalehere  as a referral from Dr. Reymundo Poll migraines. Past medical history t Graves' disease and thyrotoxicosis. She has not really had headaches int he past, may had some in college. Father and aunt had a brain aneurysm both ruptured her aunt died from it fatjer was hospitalized and air lifted when is cerebral aneurysm ruptured. She had headaches in college but that is the last time she had headache. This started about 1.5 months ago. At least 10-15 headaches since then severe and persistent. She was diagnosed with a severe sinus infection recently, she has had severe congestion for 2 weeks the headache started previous possibly to the infection but unclear when her infection started. She is still congested and drainage no sore throat no hearing changes but her ear does hurt. Father and his sister have had aneurysms. Both ruptured. No Fhx of migraines. It started on the sides pounding, now more in the front, more on the right above the eyes, pounding and throbbing, right ear hurting. She cuts of the light which makes it better, +photophobia, (she has an 40 year old and 8 month twins), sound doesn't bother her, no nausea, no vomiting, feels like both eyes are swollen with dark under the eyes, she is having blurry vision as well with the headches. The headaches can be 10/10 in pain she had to pull over the car due to the pain it was so bad. Headaches last an hour and recur multiple times a day. Advil and excedrin have helped, no medication overuse. Having the headache every other day at least. No new medications, no previous illnesses, she was at home when it started initially acutely, not associated with time of day or position. She hears a heartbeat in her ear, she describes Pulsatile tinnitus as well. She has taken 2 rounds of a z-pack without relief  still with congestion, ear pain and cough.  Reviewed notes, labs and imaging from outside physicians, which showed:   TSH is 0.110 11/03/2015  Review of  Systems: Patient complains of symptoms per HPI as well as the following symptoms: Weight gain, fatigue, blurred vision, eye pain, cough, ringing in ears, allergies, runny nose. Pertinent negatives per HPI. All others negative.   Social History   Social History  . Marital status: Married    Spouse name: Jasmine December  . Number of children: 3  . Years of education: PhD   Occupational History  . University     Social History Main Topics  . Smoking status: Never Smoker  . Smokeless tobacco: Never Used  . Alcohol use No  . Drug use: No  . Sexual activity: Yes    Partners: Male    Birth control/ protection: Condom   Other Topics Concern  . Not on file   Social History Narrative   Lives with children   Right-handed   Caffeine use: Soda once per week    Family History  Problem Relation Age of Onset  . Diabetes Mother   . Heart disease Mother   . COPD Mother   . Asthma Mother   . Diabetes Father   . Sarcoidosis Father   . Diabetes Sister     Past Medical History:  Diagnosis Date  . Broken ankle 10/2014   left   . Graves' disease   . HYPERTHYROIDISM 11/22/2006   Qualifier: Diagnosis of  By: Everardo All MD, Cleophas Dunker PPH (postpartum hemorrhage) no blood   per patient    Past Surgical History:  Procedure Laterality Date  . FOOT SURGERY    . NO PAST SURGERIES    . VAGINAL DELIVERY  02/24/2015   Procedure: VAGINAL DELIVERY;  Surgeon: Carrington Clamp, MD;  Location: [facility];  Service: Obstetrics;;  double set up twins     Current Outpatient Prescriptions  Medication Sig Dispense Refill  . ALPRAZolam (XANAX) 0.5 MG tablet Take 1-2 tablets 30-60 minutes before MRI. May take additional if needed. Do not drive. 5 tablet 0  . methimazole (TAPAZOLE) 10 MG tablet Take 1 tablet (10 mg total) by mouth 2 (two) times daily. 60 tablet 4   No current facility-administered medications for this visit.     Allergies as of 05/31/2016 - Review Complete 05/31/2016  Allergen Reaction Noted   . Adhesive [tape] Rash 01/13/2015    Vitals: BP 103/67   Pulse 65   Ht 5\' 6"  (1.676 m)   Wt 160 lb 6.4 oz (72.8 kg)   BMI 25.89 kg/m  Last Weight:  Wt Readings from Last 1 Encounters:  05/31/16 160 lb 6.4 oz (72.8 kg)   Last Height:   Ht Readings from Last 1 Encounters:  05/31/16 5\' 6"  (1.676 m)     Physical exam: Exam: Gen: NAD, conversant, well nourised, well groomed                     CV: RRR, no MRG. No Carotid Bruits. No peripheral edema, warm, nontender Eyes: Conjunctivae clear without exudates or hemorrhage  Neuro: Detailed Neurologic Exam  Speech:    Speech is normal; fluent and spontaneous with normal comprehension.  Cognition:    The patient is oriented to person, place, and time;     recent and remote memory intact;     language fluent;     normal attention, concentration,     fund of knowledge Cranial  Nerves:    The pupils are equal, round, and reactive to light. The fundi are normal and spontaneous venous pulsations are present. Visual fields are full to finger confrontation. Extraocular movements are intact. Trigeminal sensation is intact and the muscles of mastication are normal. The face is symmetric. The palate elevates in the midline. Hearing intact. Voice is normal. Shoulder shrug is normal. The tongue has normal motion without fasciculations.   Coordination:    Normal finger to nose and heel to shin. Normal rapid alternating movements.   Gait:    Heel-toe and tandem gait are normal.   Motor Observation    No asymmetry, no atrophy, and no involuntary movements noted. Tone:    Normal muscle tone.    Posture:    Posture is normal. normal erect    Strength:    Strength is V/V in the upper and lower limbs.      Sensation: intact to LT     Reflex Exam:  DTR's:    Deep tendon reflexes in the upper and lower extremities are normal bilaterally.   Toes:    The toes are downgoing bilaterally.   Clonus:    Clonus is absent.       Assessment/Plan:  Had a long discussion with patient and her family at last appointment about the MRI of her brain, reviewed images. The lesions are suspicious for demyelinating disease such as MS but patient appears to be asymptomatic. 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 Radiological progression usually occurs in approximately 66% of patients with RIS, while ~ 33% per 5 year period will subsequently develop neurological symptoms. While conversion rate roughly equates 33% per 5 year period, a significant number of patients will not subsequently develop MS. Due to absence of clear risk factors defining clinical conversion and lack of current evidence usually no treatment is initiated.  Repeat MRI brain for abnormal white matter lesions suggestive of MS and vision changes. Also MRI cervical spine for evaluation of MS or Devic's disease.  Will rescan every 6 months thereafter  CC: LAZO,IVAN, MD  Orders Placed This Encounter  Procedures  . MR BRAIN W WO CONTRAST  . MR CERVICAL SPINE W WO CONTRAST    Naomie Dean, MD  [facility] [address] [place], [place] [address]  Phone [phone] Fax [phone]  A total of 25 minutes was spent face-to-face with this patient. Over half this time was spent on counseling patient on the Radiologically isolated syndromediagnosis and different diagnostic and therapeutic options available.

## 2016-06-01 ENCOUNTER — Telehealth: Payer: Self-pay | Admitting: Neurology

## 2016-06-01 NOTE — Telephone Encounter (Signed)
[employer] did not approve the MRI brain.. Needing more clinical information.  The phone number for the peer to peer is [phone]. The Member ID is [id number] & DOB is 07-Nov-1975.. This case closes on 06/05/16 and she is scheduled to have both of these exams done on 06/21/16 at our [facility] mobile unit.. Thank you for your help!

## 2016-06-02 NOTE — Telephone Encounter (Signed)
[id number] through 4/27. Approved.

## 2016-06-05 NOTE — Telephone Encounter (Signed)
Noted, thank you
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
## 2016-06-21 ENCOUNTER — Ambulatory Visit (INDEPENDENT_AMBULATORY_CARE_PROVIDER_SITE_OTHER): Payer: BC Managed Care – PPO

## 2016-06-21 DIAGNOSIS — H539 Unspecified visual disturbance: Secondary | ICD-10-CM | POA: Diagnosis not present

## 2016-06-21 DIAGNOSIS — R9082 White matter disease, unspecified: Secondary | ICD-10-CM

## 2016-06-21 DIAGNOSIS — G35 Multiple sclerosis: Secondary | ICD-10-CM

## 2016-06-21 DIAGNOSIS — R930 Abnormal findings on diagnostic imaging of skull and head, not elsewhere classified: Secondary | ICD-10-CM

## 2016-06-21 MED ORDER — GADOPENTETATE DIMEGLUMINE 469.01 MG/ML IV SOLN: 15.0000 mL | Freq: Once | INTRAVENOUS | Status: AC | PRN

## 2016-06-26 ENCOUNTER — Telehealth: Payer: Self-pay | Admitting: *Deleted

## 2016-06-26 NOTE — Telephone Encounter (Signed)
-----   Message from Antonia B Ahern, MD sent at 06/23/2016  9:49 AM EDT ----- White matter changes in the brain are unchanged compared to MRI last September which is great news. MRI of the cervical spine is normal, also great. We will repeat imaging in 6 months. Have her follow up with me in 6 months. Thanks! 

## 2016-06-26 NOTE — Telephone Encounter (Signed)
One year is fine! Thanks!

## 2016-06-26 NOTE — Telephone Encounter (Signed)
-----   Message from Anson Fret, MD sent at 06/23/2016  9:49 AM EDT ----- [place]on Asters matter changes in the brain are unchanged compared to MRI last September which is great news. MRI of the cervical spine is normal, also great. We will repeat imaging in 6 months. Have her follow up with me in 6 months. Thanks!

## 2016-06-26 NOTE — Telephone Encounter (Signed)
I have spoken with Bita this morning and per Dr. Lucia Gaskins, reviewed MRI results as below.  Shanteria verbalized understanding of same, asked if it would be possible to repeat MRI's in 1 yr. instead of 6 mos, due to cost of studies.  I will check with Dr. Lucia Gaskins and call pt. back/fim

## 2016-06-28 NOTE — Telephone Encounter (Signed)
Called and notified pt of yearly MRIs instead of twice per year. Voiced appreciation.

## 2016-09-07 ENCOUNTER — Other Ambulatory Visit: Payer: Self-pay | Admitting: Endocrinology

## 2016-09-11 ENCOUNTER — Encounter: Payer: Self-pay | Admitting: Endocrinology

## 2016-09-11 ENCOUNTER — Ambulatory Visit (INDEPENDENT_AMBULATORY_CARE_PROVIDER_SITE_OTHER): Payer: BC Managed Care – PPO | Admitting: Endocrinology

## 2016-09-11 VITALS — BP 122/82 | HR 101 | Ht 66.0 in | Wt 165.0 lb

## 2016-09-11 DIAGNOSIS — E0590 Thyrotoxicosis, unspecified without thyrotoxic crisis or storm: Secondary | ICD-10-CM

## 2016-09-11 LAB — TSH: TSH: <0.01 u[IU]/mL — ABNORMAL LOW (ref 0.35–4.50)

## 2016-09-11 LAB — T4, FREE: FREE T4: 4.27 ng/dL — AB (ref 0.60–1.60)

## 2016-09-11 MED ORDER — METHIMAZOLE 10 MG PO TABS: 20.0000 mg | ORAL_TABLET | Freq: Two times a day (BID) | ORAL | 4 refills | Status: DC

## 2016-09-11 NOTE — Progress Notes (Signed)
   Subjective:    Patient ID: Connie [name], female    DOB: 03/16/1975, 41 y.o.   MRN: [id number]  HPI Pt returns for f/u of hyperthyroidism, due to Grave's Dz (dx'ed 2008, on a routine blood test; scan was c/w Grave's Dz; she was scheduled for RAI rx in 2008, but pregnancy test on that day was positive; she took tapazole for a few mos, but stopped in late 2015, in preparation for 2016 pregnancy; she declines RAI, due to need to care for children).   pt states she feels well in general.  She has regained a few lbs.  She has been out of tapazole x 2 weeks.   Past Medical History:  Diagnosis Date  . Broken ankle 10/2014   left   . Graves' disease   . HYPERTHYROIDISM 11/22/2006   Qualifier: Diagnosis of  By: Everardo All MD, Cleophas Dunker PPH (postpartum hemorrhage) no blood   per patient    Past Surgical History:  Procedure Laterality Date  . FOOT SURGERY    . NO PAST SURGERIES    . VAGINAL DELIVERY  02/24/2015   Procedure: VAGINAL DELIVERY;  Surgeon: Carrington Clamp, MD;  Location: [facility];  Service: Obstetrics;;  double set up twins     Social History   Social History  . Marital status: Married    Spouse name: Jasmine December  . Number of children: 3  . Years of education: PhD   Occupational History  . University     Social History Main Topics  . Smoking status: Never Smoker  . Smokeless tobacco: Never Used  . Alcohol use No  . Drug use: No  . Sexual activity: Yes    Partners: Male    Birth control/ protection: Condom   Other Topics Concern  . Not on file   Social History Narrative   Lives with children   Right-handed   Caffeine use: Soda once per week    No current outpatient prescriptions on file prior to visit.   Current Facility-Administered Medications on File Prior to Visit  Medication Dose Route Frequency Provider Last Rate Last Dose  . gadopentetate dimeglumine (MAGNEVIST) injection 15 mL  15 mL Intravenous Once PRN Anson Fret, MD        Allergies  Allergen  Reactions  . Adhesive [Tape] Rash    Paper tape is fine    Family History  Problem Relation Age of Onset  . Diabetes Mother   . Heart disease Mother   . COPD Mother   . Asthma Mother   . Diabetes Father   . Sarcoidosis Father   . Diabetes Sister     BP 122/82   Pulse (!) 101   Ht 5\' 6"  (1.676 m)   Wt 165 lb (74.8 kg)   SpO2 97%   BMI 26.63 kg/m    Review of Systems She denies fever.     Objective:   Physical Exam VITAL SIGNS:  See vs page GENERAL: no distress NECK: There is no palpable thyroid enlargement.  No thyroid nodule is palpable.  No palpable lymphadenopathy at the anterior neck.  Lab Results  Component Value Date   TSH <0.01 Repeated and verified X2. (L) 09/11/2016      Assessment & Plan:  Hyperthyroidism, worse.  Increase tapazole to 20 mg BID.  Please come back for a follow-up appointment in 2 months.  Noncompliance with medication.  Despite this, she should increase rx.

## 2016-09-11 NOTE — Patient Instructions (Signed)
Thyroid blood tests are requested for you today.  We'll let you know about the results.  if ever you have fever while taking methimazole, stop it and call us, even if the reason is obvious, because of the risk of a rare side-effect.   Please come back for a follow-up appointment in 3 months.    

## 2016-12-04 ENCOUNTER — Ambulatory Visit: Payer: BC Managed Care – PPO | Admitting: Neurology

## 2017-01-18 ENCOUNTER — Other Ambulatory Visit: Payer: Self-pay

## 2017-01-18 ENCOUNTER — Telehealth: Payer: Self-pay | Admitting: Endocrinology

## 2017-01-18 NOTE — Telephone Encounter (Signed)
New Message   *STAT* If patient is at the pharmacy, call can be transferred to refill team.   1. Which medications need to be refilled? (please list name of each medication and dose if known)  Methimazole (TAPAZOLE) 10 mg tablet, currently states to take 2 tablets daily but she was informed by MD to take four tablets daily and it needs to be updated and pt is about to run out due to wrong dosage sent previously.  2. Which pharmacy/location (including street and city if local pharmacy) is medication to be sent to? [facility] [address] [place], [place] [address] [phone]  3. Do they need a 30 day or 90 day supply? 30 day supply

## 2017-01-19 ENCOUNTER — Other Ambulatory Visit: Payer: Self-pay

## 2017-01-19 MED ORDER — METHIMAZOLE 10 MG PO TABS: 20.0000 mg | ORAL_TABLET | Freq: Two times a day (BID) | ORAL | 4 refills | Status: AC

## 2017-01-19 NOTE — Telephone Encounter (Signed)
Please increase to 2x10 mg, BID

## 2017-01-19 NOTE — Telephone Encounter (Signed)
I have sent in prescription.  

## 2017-01-19 NOTE — Telephone Encounter (Signed)
I think this patient is confused but can you clarify dose for me so we can send in appropriate prescription?

## 2017-03-14 ENCOUNTER — Ambulatory Visit: Payer: BC Managed Care – PPO | Admitting: Endocrinology
